# Patient Record
Sex: Male | Born: 1965 | Race: Black or African American | Hispanic: No | Marital: Single | Smoking: Never smoker
Health system: Southern US, Community
[De-identification: ages and names within clinical notes are randomized; demographics above are authoritative.]

## PROBLEM LIST (undated history)

## (undated) ENCOUNTER — Ambulatory Visit: Admission: EM | Source: Home / Self Care

## (undated) DIAGNOSIS — E785 Hyperlipidemia, unspecified: Secondary | ICD-10-CM

## (undated) DIAGNOSIS — E119 Type 2 diabetes mellitus without complications: Secondary | ICD-10-CM

## (undated) DIAGNOSIS — I10 Essential (primary) hypertension: Secondary | ICD-10-CM

## (undated) HISTORY — DX: Essential (primary) hypertension: I10

## (undated) HISTORY — PX: KNEE SURGERY: SHX244

## (undated) HISTORY — DX: Type 2 diabetes mellitus without complications: E11.9

## (undated) HISTORY — DX: Hyperlipidemia, unspecified: E78.5

---

## 1997-10-25 ENCOUNTER — Emergency Department (HOSPITAL_COMMUNITY): Admission: EM | Admit: 1997-10-25 | Discharge: 1997-10-25 | Payer: Self-pay | Admitting: Emergency Medicine

## 2018-04-15 ENCOUNTER — Encounter: Payer: Self-pay | Admitting: Gastroenterology

## 2018-05-20 ENCOUNTER — Ambulatory Visit (AMBULATORY_SURGERY_CENTER): Payer: Self-pay | Admitting: *Deleted

## 2018-05-20 ENCOUNTER — Encounter (INDEPENDENT_AMBULATORY_CARE_PROVIDER_SITE_OTHER): Payer: Self-pay

## 2018-05-20 ENCOUNTER — Encounter: Payer: Self-pay | Admitting: Gastroenterology

## 2018-05-20 VITALS — Ht 78.0 in | Wt 284.0 lb

## 2018-05-20 DIAGNOSIS — Z1211 Encounter for screening for malignant neoplasm of colon: Secondary | ICD-10-CM

## 2018-05-20 MED ORDER — NA SULFATE-K SULFATE-MG SULF 17.5-3.13-1.6 GM/177ML PO SOLN: 1.0000 | Freq: Once | ORAL | 0 refills | Status: AC

## 2018-05-20 NOTE — Progress Notes (Signed)
No egg or soy allergy known to patient  No issues with past sedation with any surgeries  or procedures, no intubation problems  No diet pills per patient No home 02 use per patient  No blood thinners per patient  Pt denies issues with constipation  No A fib or A flutter   EMMI video sent to pt's e mail  - Suprep $15 coupon to pt -

## 2018-05-23 ENCOUNTER — Encounter: Payer: Self-pay | Admitting: Gastroenterology

## 2018-05-29 ENCOUNTER — Encounter: Payer: Self-pay | Admitting: Gastroenterology

## 2018-06-18 ENCOUNTER — Ambulatory Visit (AMBULATORY_SURGERY_CENTER): Payer: 59 | Admitting: Gastroenterology

## 2018-06-18 ENCOUNTER — Encounter: Payer: Self-pay | Admitting: Gastroenterology

## 2018-06-18 VITALS — BP 116/80 | HR 73 | Temp 98.4°F | Resp 11 | Ht 78.0 in | Wt 284.0 lb

## 2018-06-18 DIAGNOSIS — K5730 Diverticulosis of large intestine without perforation or abscess without bleeding: Secondary | ICD-10-CM

## 2018-06-18 DIAGNOSIS — D175 Benign lipomatous neoplasm of intra-abdominal organs: Secondary | ICD-10-CM

## 2018-06-18 DIAGNOSIS — D125 Benign neoplasm of sigmoid colon: Secondary | ICD-10-CM

## 2018-06-18 DIAGNOSIS — Z1211 Encounter for screening for malignant neoplasm of colon: Secondary | ICD-10-CM | POA: Diagnosis present

## 2018-06-18 DIAGNOSIS — K635 Polyp of colon: Secondary | ICD-10-CM | POA: Diagnosis not present

## 2018-06-18 HISTORY — PX: COLONOSCOPY: SHX174

## 2018-06-18 MED ORDER — SODIUM CHLORIDE 0.9 % IV SOLN: 500.0000 mL | Freq: Once | INTRAVENOUS | Status: DC

## 2018-06-18 NOTE — Progress Notes (Signed)
Report to PACU, RN, vss, BBS= Clear.  

## 2018-06-18 NOTE — Progress Notes (Signed)
Called to room to assist during endoscopic procedure.  Patient ID and intended procedure confirmed with present staff. Received instructions for my participation in the procedure from the performing physician.  

## 2018-06-18 NOTE — Op Note (Signed)
[facility] Patient Name: Ryan Padilla Procedure Date: 06/18/2018 9:12 AM MRN: [id number] Endoscopist: Gerrit Heck , MD Age: 53 Referring MD:  Date of Birth: November 05, 1965 Gender: Male Account #: [id number] Procedure:                Colonoscopy Indications:              Screening for colorectal malignant neoplasm, This                            is the patient's first colonoscopy Medicines:                Monitored Anesthesia Care Procedure:                Pre-Anesthesia Assessment:                           - Prior to the procedure, a History and Physical                            was performed, and patient medications and                            allergies were reviewed. The patient's tolerance of                            previous anesthesia was also reviewed. The risks                            and benefits of the procedure and the sedation                            options and risks were discussed with the patient.                            All questions were answered, and informed consent                            was obtained. Prior Anticoagulants: The patient has                            taken no previous anticoagulant or antiplatelet                            agents. ASA Grade Assessment: II - A patient with                            mild systemic disease. After reviewing the risks                            and benefits, the patient was deemed in                            satisfactory condition to undergo the procedure.  After obtaining informed consent, the colonoscope                            was passed under direct vision. Throughout the                            procedure, the patient's blood pressure, pulse, and                            oxygen saturations were monitored continuously. The                            Colonoscope was introduced through the anus and                            advanced to the the terminal  ileum. The colonoscopy                            was performed without difficulty. The patient                            tolerated the procedure well. The quality of the                            bowel preparation was adequate. Scope In: 9:22:22 AM Scope Out: 9:39:08 AM Scope Withdrawal Time: 0 hours 15 minutes 5 seconds  Total Procedure Duration: 0 hours 16 minutes 46 seconds  Findings:                 The perianal and digital rectal examinations were                            normal.                           A 8 mm polyp was found in the sigmoid colon. The                            polyp was sessile. The polyp was removed with a                            cold snare. Resection and retrieval were complete.                            Estimated blood loss was minimal.                           A few small-mouthed diverticula were found in the                            sigmoid colon, descending colon, transverse colon                            and ascending colon.  There was a small lipoma, in the sigmoid colon.                            Positive "pillow sign" noted with closed snare. The                            overlying mucosa was otherwise normal appearing.                           Retroflexion in the right colon was performed.                           The retroflexed view of the distal rectum and anal                            verge was normal and showed no anal or rectal                            abnormalities.                           The terminal ileum appeared normal. Complications:            No immediate complications. Estimated Blood Loss:     Estimated blood loss was minimal. Impression:               - One 8 mm polyp in the sigmoid colon, removed with                            a cold snare. Resected and retrieved.                           - Diverticulosis in the sigmoid colon, in the                            descending colon, in  the transverse colon and in                            the ascending colon.                           - Small lipoma in the sigmoid colon.                           - The distal rectum and anal verge are normal on                            retroflexion view.                           - The examined portion of the ileum was normal. Recommendation:           - Patient has a contact number available for  emergencies. The signs and symptoms of potential                            delayed complications were discussed with the                            patient. Return to normal activities tomorrow.                            Written discharge instructions were provided to the                            patient.                           - Resume previous diet today.                           - Continue present medications.                           - Await pathology results.                           - Repeat colonoscopy in 5-10 years for surveillance                            based on pathology results.                           - Return to GI clinic PRN. Gerrit Heck, MD 06/18/2018 9:46:42 AM

## 2018-06-18 NOTE — Patient Instructions (Signed)
YOU HAD AN ENDOSCOPIC PROCEDURE TODAY AT THE [facility]:   Refer to the procedure report that was given to you for any specific questions about what was found during the examination.  If the procedure report does not answer your questions, please call your gastroenterologist to clarify.  If you requested that your care partner not be given the details of your procedure findings, then the procedure report has been included in a sealed envelope for you to review at your convenience later.  YOU SHOULD EXPECT: Some feelings of bloating in the abdomen. Passage of more gas than usual.  Walking can help get rid of the air that was put into your GI tract during the procedure and reduce the bloating. If you had a lower endoscopy (such as a colonoscopy or flexible sigmoidoscopy) you may notice spotting of blood in your stool or on the toilet paper. If you underwent a bowel prep for your procedure, you may not have a normal bowel movement for a few days.  Please Note:  You might notice some irritation and congestion in your nose or some drainage.  This is from the oxygen used during your procedure.  There is no need for concern and it should clear up in a day or so.  SYMPTOMS TO REPORT IMMEDIATELY:   Following lower endoscopy (colonoscopy or flexible sigmoidoscopy):  Excessive amounts of blood in the stool  Significant tenderness or worsening of abdominal pains  Swelling of the abdomen that is new, acute  Fever of 100F or higher  For urgent or emergent issues, a gastroenterologist can be reached at any hour by calling [phone].   DIET:  We do recommend a small meal at first, but then you may proceed to your regular diet.  Drink plenty of fluids but you should avoid alcoholic beverages for 24 hours.  ACTIVITY:  You should plan to take it easy for the rest of today and you should NOT DRIVE or use heavy machinery until tomorrow (because of the sedation medicines used during the test).     FOLLOW UP: Our staff will call the number listed on your records the next business day following your procedure to check on you and address any questions or concerns that you may have regarding the information given to you following your procedure. If we do not reach you, we will leave a message.  However, if you are feeling well and you are not experiencing any problems, there is no need to return our call.  We will assume that you have returned to your regular daily activities without incident.  If any biopsies were taken you will be contacted by phone or by letter within the next 1-3 weeks.  Please call us at [phone] if you have not heard about the biopsies in 3 weeks.    SIGNATURES/CONFIDENTIALITY: You and/or your care partner have signed paperwork which will be entered into your electronic medical record.  These signatures attest to the fact that that the information above on your After Visit Summary has been reviewed and is understood.  Full responsibility of the confidentiality of this discharge information lies with you and/or your care-partner. 

## 2018-06-19 ENCOUNTER — Telehealth: Payer: Self-pay | Admitting: *Deleted

## 2018-06-19 NOTE — Telephone Encounter (Signed)
  Follow up Call-  Call back number 06/18/2018  Post procedure Call Back phone  # [phone]  Permission to leave phone message Yes  Some recent data might be hidden     Patient questions:  Do you have a fever, pain , or abdominal swelling? No. Pain Score  0 *  Have you tolerated food without any problems? Yes.    Have you been able to return to your normal activities? Yes.    Do you have any questions about your discharge instructions: Diet   No. Medications  No. Follow up visit  No.  Do you have questions or concerns about your Care? No.  Actions: * If pain score is 4 or above: No action needed, pain <4.

## 2018-06-21 ENCOUNTER — Encounter: Payer: Self-pay | Admitting: Gastroenterology

## 2018-09-19 ENCOUNTER — Other Ambulatory Visit: Payer: Self-pay

## 2018-09-19 ENCOUNTER — Ambulatory Visit (HOSPITAL_COMMUNITY)
Admission: EM | Admit: 2018-09-19 | Discharge: 2018-09-19 | Disposition: A | Discharge status: Home / Self Care | Payer: 59 | Attending: Physician Assistant | Admitting: Physician Assistant

## 2018-09-19 ENCOUNTER — Ambulatory Visit: Payer: Self-pay | Admitting: Family Medicine

## 2018-09-19 ENCOUNTER — Encounter (HOSPITAL_COMMUNITY): Payer: Self-pay

## 2018-09-19 DIAGNOSIS — L03011 Cellulitis of right finger: Secondary | ICD-10-CM

## 2018-09-19 DIAGNOSIS — L03012 Cellulitis of left finger: Secondary | ICD-10-CM

## 2018-09-19 MED ORDER — DOXYCYCLINE HYCLATE 100 MG PO CAPS: 100.0000 mg | ORAL_CAPSULE | Freq: Two times a day (BID) | ORAL | 0 refills | Status: DC

## 2018-09-19 NOTE — ED Triage Notes (Signed)
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

## 2018-09-19 NOTE — Discharge Instructions (Signed)
Start doxycycline as directed. Warm compress daily. Monitor for spreading redness, increased warmth, fever, follow up for reevaluation needed.

## 2018-09-19 NOTE — Telephone Encounter (Signed)
Pt. Calling for urgent care information in [place]. PCP is in [place]. Given [facility] information - locations and hours of operation.

## 2018-09-19 NOTE — ED Provider Notes (Addendum)
[facility]    CSN: [id number] Arrival date & time: 09/19/18  8921     History   Chief Complaint Chief Complaint  Patient presents with  . Abscess    HPI Ryan Padilla is a 53 y.o. male.   53 year old male comes in for few day history of left finger pain with swelling.  Has also had erythema, warmth.  Swelling has been gradually worsening, and patient noticed self drainage of purulent fluid today.  Denies fever, chills, night sweats.  Has decreased range of motion due to swelling and pain.  Denies numbness, tingling.  Has history of MRSA. Has not taken anything for the symptoms.      Past Medical History:  Diagnosis Date  . Hypertension     There are no active problems to display for this patient.   Past Surgical History:  Procedure Laterality Date  . KNEE SURGERY Right    1985, 1991       Home Medications    Prior to Admission medications   Medication Sig Start Date End Date Taking? Authorizing Provider  valsartan (DIOVAN) 80 MG tablet Take by mouth. 03/29/18  Yes [provider]  clindamycin (CLEOCIN) 300 MG capsule Take 1 capsule (300 mg total) by mouth 3 (three) times daily for 10 days. 09/21/18 10/01/18  Scot Jun, FNP  predniSONE (DELTASONE) 20 MG tablet Take 3 tablets (60 mg total) by mouth daily with breakfast for 3 days. Start medication 09/23/18 take for 3 days only 09/23/18 09/26/18  Scot Jun, FNP    Family History Family History  Problem Relation Age of Onset  . Hypertension Mother   . Colon cancer Neg Hx   . Colon polyps Neg Hx   . Esophageal cancer Neg Hx   . Rectal cancer Neg Hx   . Stomach cancer Neg Hx     Social History Social History   Tobacco Use  . Smoking status: Never Smoker  . Smokeless tobacco: Never Used  Substance Use Topics  . Alcohol use: Yes    Comment: occ  . Drug use: Never     Allergies   Patient has no known allergies.   Review of Systems Review of Systems  Reason unable to  perform ROS: See HPI as above.     Physical Exam Triage Vital Signs ED Triage Vitals  Enc Vitals Group     BP 09/19/18 0854 (!) 147/99     Pulse Rate 09/19/18 0854 96     Resp 09/19/18 0854 18     Temp 09/19/18 0854 97.8 F (36.6 C)     Temp src --      SpO2 09/19/18 0854 97 %     Weight --      Height --      Head Circumference --      Peak Flow --      Pain Score 09/19/18 0856 7     Pain Loc --      Pain Edu? --      Excl. in [place]? --    No data found.  Updated Vital Signs BP (!) 147/99   Pulse 96   Temp 97.8 F (36.6 C)   Resp 18   SpO2 97%   Physical Exam Constitutional:      General: He is not in acute distress.    Appearance: He is well-developed. He is not diaphoretic.  HENT:     Head: Normocephalic and atraumatic.  Eyes:  Conjunctiva/sclera: Conjunctivae normal.     Pupils: Pupils are equal, round, and reactive to light.  Musculoskeletal:     Comments: See picture below.  Erythema, swelling, warmth to the left index finger extending to the dorsal hand.  No fluctuance felt.  Diffuse tenderness to palpation of PIP, hand. No tenderness to palpation distal finger. Decreased ROM. Sensation intact, cap refill <2s  Neurological:     Mental Status: He is alert and oriented to person, place, and time.        UC Treatments / Results  Labs (all labs ordered are listed, but only abnormal results are displayed) Labs Reviewed - No data to display  EKG None  Radiology No results found.  Procedures Procedures (including critical care time)  Medications Ordered in UC Medications - No data to display  Initial Impression / Assessment and Plan / UC Course  I have reviewed the triage vital signs and the nursing notes.  Pertinent labs & imaging results that were available during my care of the patient were reviewed by me and considered in my medical decision making (see chart for details).    Start doxycycline as directed.  Warm compress, rest.  Return  precautions given.  Patient expresses understanding and agrees to plan.  Final Clinical Impressions(s) / UC Diagnoses   Final diagnoses:  Cellulitis of left index finger    ED Prescriptions    Medication Sig Dispense Auth. Provider   doxycycline (VIBRAMYCIN) 100 MG capsule Take 1 capsule (100 mg total) by mouth 2 (two) times daily. 20 capsule Oneita Kras 09/19/18 [place],  V, PA-C 09/26/18 1115

## 2018-09-19 NOTE — ED Notes (Signed)
Patient verbalizes understanding of discharge instructions. Opportunity for questioning and answers were provided. Patient discharged from [facility] by provider.  

## 2018-09-21 ENCOUNTER — Other Ambulatory Visit: Payer: Self-pay

## 2018-09-21 ENCOUNTER — Encounter (HOSPITAL_COMMUNITY): Payer: Self-pay

## 2018-09-21 ENCOUNTER — Ambulatory Visit (HOSPITAL_COMMUNITY)
Admission: EM | Admit: 2018-09-21 | Discharge: 2018-09-21 | Disposition: A | Discharge status: Home / Self Care | Payer: 59 | Attending: Family Medicine | Admitting: Family Medicine

## 2018-09-21 DIAGNOSIS — L0291 Cutaneous abscess, unspecified: Secondary | ICD-10-CM | POA: Insufficient documentation

## 2018-09-21 DIAGNOSIS — L03012 Cellulitis of left finger: Secondary | ICD-10-CM | POA: Diagnosis not present

## 2018-09-21 DIAGNOSIS — Z23 Encounter for immunization: Secondary | ICD-10-CM

## 2018-09-21 DIAGNOSIS — L02512 Cutaneous abscess of left hand: Secondary | ICD-10-CM | POA: Diagnosis not present

## 2018-09-21 LAB — CBC
HCT: 47 % (ref 39.0–52.0)
Hemoglobin: 15.7 g/dL (ref 13.0–17.0)
MCH: 30.2 pg (ref 26.0–34.0)
MCHC: 33.4 g/dL (ref 30.0–36.0)
MCV: 90.4 fL (ref 80.0–100.0)
Platelets: 211 10*3/uL (ref 150–400)
RBC: 5.2 MIL/uL (ref 4.22–5.81)
RDW: 14.2 % (ref 11.5–15.5)
WBC: 7.3 10*3/uL (ref 4.0–10.5)
nRBC: 0 % (ref 0.0–0.2)

## 2018-09-21 MED ORDER — BUPIVACAINE HCL (PF) 0.5 % IJ SOLN
INTRAMUSCULAR | Status: AC
  Filled 2018-09-21: qty 10

## 2018-09-21 MED ORDER — LIDOCAINE-EPINEPHRINE (PF) 2 %-1:200000 IJ SOLN
INTRAMUSCULAR | Status: AC
  Filled 2018-09-21: qty 20

## 2018-09-21 MED ORDER — CLINDAMYCIN HCL 300 MG PO CAPS: 300.0000 mg | ORAL_CAPSULE | Freq: Three times a day (TID) | ORAL | 0 refills | Status: AC

## 2018-09-21 MED ORDER — CEFTRIAXONE SODIUM 250 MG IJ SOLR
250.0000 mg | Freq: Once | INTRAMUSCULAR | Status: AC
  Administered 2018-09-21: 250 mg via INTRAMUSCULAR

## 2018-09-21 MED ORDER — TETANUS-DIPHTHERIA TOXOIDS TD 5-2 LFU IM INJ
0.5000 mL | INJECTION | Freq: Once | INTRAMUSCULAR | Status: AC
  Administered 2018-09-21: 0.5 mL via INTRAMUSCULAR

## 2018-09-21 MED ORDER — CEFTRIAXONE SODIUM 250 MG IJ SOLR
INTRAMUSCULAR | Status: AC
  Filled 2018-09-21: qty 250

## 2018-09-21 MED ORDER — PREDNISONE 20 MG PO TABS: 60.0000 mg | ORAL_TABLET | Freq: Every day | ORAL | 0 refills | Status: DC

## 2018-09-21 MED ORDER — LIDOCAINE HCL 2 % IJ SOLN
INTRAMUSCULAR | Status: AC
  Filled 2018-09-21: qty 20

## 2018-09-21 MED ORDER — TETANUS-DIPHTH-ACELL PERTUSSIS 5-2.5-18.5 LF-MCG/0.5 IM SUSP
INTRAMUSCULAR | Status: AC
  Filled 2018-09-21: qty 0.5

## 2018-09-21 NOTE — ED Triage Notes (Signed)
Pt presents for a follow up on left index finger from visit from a few days ago; pt says finger is not getting better.

## 2018-09-21 NOTE — ED Provider Notes (Signed)
[facility]    CSN: [id number] Arrival date & time: 09/21/18  1141     History   Chief Complaint Chief Complaint  Patient presents with  . Follow-up    HPI Ryan Padilla is a 53 y.o. male.   HPI  Ryan Padilla presents for follow-up of left index finger swelling. Patient was seen by a provider here at urgent care, 4 days prior for the same problem and diagnosed with cellulitis. He has taken 4 days to Doxycyline and symptoms have since worsened. He is having increased tenderness, swelling, and redness with poor ROM of left index finger. He has noticed mild swelling of the lower portion of left anterior hand. He has history of MRSA. He denies fever, chills, or GI symptoms. Past Medical History:  Diagnosis Date  . Hypertension     There are no active problems to display for this patient.   Past Surgical History:  Procedure Laterality Date  . KNEE SURGERY Right    1985, 1991       Home Medications    Prior to Admission medications   Medication Sig Start Date End Date Taking? Authorizing Provider  doxycycline (VIBRAMYCIN) 100 MG capsule Take 1 capsule (100 mg total) by mouth 2 (two) times daily. 09/19/18   Tasia Catchings, Amy V, PA-C  valsartan (DIOVAN) 80 MG tablet Take by mouth. 03/29/18   [provider]    Family History Family History  Problem Relation Age of Onset  . Hypertension Mother   . Colon cancer Neg Hx   . Colon polyps Neg Hx   . Esophageal cancer Neg Hx   . Rectal cancer Neg Hx   . Stomach cancer Neg Hx     Social History Social History   Tobacco Use  . Smoking status: Never Smoker  . Smokeless tobacco: Never Used  Substance Use Topics  . Alcohol use: Yes    Comment: occ  . Drug use: Never     Allergies   Patient has no known allergies.   Review of Systems Review of Systems Pertinent negatives listed in HPI Physical Exam Triage Vital Signs ED Triage Vitals  Enc Vitals Group     BP 09/21/18 1158 (!) 153/102     Pulse Rate  09/21/18 1158 (!) 103     Resp 09/21/18 1158 20     Temp 09/21/18 1158 98 F (36.7 C)     Temp Source 09/21/18 1158 Oral     SpO2 09/21/18 1158 95 %     Weight --      Height --      Head Circumference --      Peak Flow --      Pain Score 09/21/18 1159 8     Pain Loc --      Pain Edu? --      Excl. in [place]? --    No data found.  Updated Vital Signs BP (!) 153/102 (BP Location: Left Arm)   Pulse (!) 103   Temp 98 F (36.7 C) (Oral)   Resp 20   SpO2 95%   Visual Acuity Right Eye Distance:   Left Eye Distance:   Bilateral Distance:    Right Eye Near:   Left Eye Near:    Bilateral Near:     Physical Exam Constitutional:      Appearance: Normal appearance.  Cardiovascular:     Rate and Rhythm: Normal rate.  Pulmonary:     Effort: Pulmonary effort is normal.  Breath sounds: Normal breath sounds.  Musculoskeletal:        General: Swelling and tenderness present.  Skin:    General: Skin is warm.     Capillary Refill: Capillary refill takes less than 2 seconds.     Findings: Erythema and lesion present.  Neurological:     Mental Status: He is alert.    Marland Kitchenkshn          UC Treatments / Results  Labs (all labs ordered are listed, but only abnormal results are displayed) Labs Reviewed  AEROBIC CULTURE (SUPERFICIAL SPECIMEN)  CBC    EKG None  Radiology No results found.  Procedures Incision and Drainage Date/Time: 09/23/2018 6:33 AM Performed by: Scot Jun, FNP Authorized by: Scot Jun, FNP   Consent:    Consent obtained:  Verbal   Risks discussed:  Incomplete drainage and infection   Alternatives discussed:  Delayed treatment Location:    Type:  Abscess   Location:  Upper extremity   Upper extremity location:  Finger   Finger location:  L index finger Pre-procedure details:    Skin preparation:  Betadine Anesthesia (see MAR for exact dosages):    Anesthesia method:  Local infiltration and nerve block   Local  anesthetic:  Lidocaine 2% WITH epi and bupivacaine 0.5% w/o epi Procedure type:    Complexity:  Simple Procedure details:    Incision types:  Stab incision   Incision depth:  Subcutaneous   Scalpel blade:  10   Drainage:  Bloody and purulent   Drainage amount:  Moderate   Wound treatment:  Wound left open   Packing materials:  None Post-procedure details:    Patient tolerance of procedure:  Tolerated well, no immediate complications Comments:     Dressed with non-adherent pressure dressing secured with [place]    (including critical care time)  Medications Ordered in UC Medications - No data to display  Initial Impression / Assessment and Plan / UC Course  I have reviewed the triage vital signs and the nursing notes.  Pertinent labs & imaging results that were available during my care of the patient were reviewed by me and considered in my medical decision making (see chart for details).    I &D performed and a gross amount of purulent material expressed from site. Patient advised if infection doesn't improve, a second I&D may be warranted given the massive size of abscess. Changing antibiotic therapy, as infection has remained non-responsive to Doxycyline. CBC normal, which is reassuring. Prednisone prescribed to start in 3 days if swelling of hand and index finger have not improved. Work note provided. Red Flags discussed. Patient verbalized understanding and agreement of plan. Final Clinical Impressions(s) / UC Diagnoses   Final diagnoses:  Cellulitis of finger of left hand     Discharge Instructions     Stop Doxycyline. Start Clindamycin 300 mg 3 times daily for infection. You should see improvement in 3-4 days, if not or if swelling worsens return for evaluation. Start prednisone 09/23/18 (Monday) only if swelling has not improved. Return for follow-up here if no improvement. Your blood work was normal.     ED Prescriptions    Medication Sig Dispense Auth. Provider    clindamycin (CLEOCIN) 300 MG capsule Take 1 capsule (300 mg total) by mouth 3 (three) times daily for 10 days. 30 capsule Scot Jun, FNP   predniSONE (DELTASONE) 20 MG tablet Take 3 tablets (60 mg total) by mouth daily with breakfast for 3 days.  Start medication 09/23/18 take for 3 days only 9 tablet Scot Jun, FNP     Controlled Substance Prescriptions [place] Controlled Substance Registry consulted? Not Applicable   Scot Jun, FNP 09/23/18 1954

## 2018-09-21 NOTE — Discharge Instructions (Addendum)
Stop Doxycyline. Start Clindamycin 300 mg 3 times daily for infection. You should see improvement in 3-4 days, if not or if swelling worsens return for evaluation. Start prednisone 09/23/18 (Monday) only if swelling has not improved. Return for follow-up here if no improvement. Your blood work was normal.

## 2018-09-23 ENCOUNTER — Telehealth (HOSPITAL_COMMUNITY): Payer: Self-pay | Admitting: Emergency Medicine

## 2018-09-23 DIAGNOSIS — L03012 Cellulitis of left finger: Secondary | ICD-10-CM

## 2018-09-23 DIAGNOSIS — L02512 Cutaneous abscess of left hand: Secondary | ICD-10-CM

## 2018-09-23 LAB — AEROBIC CULTURE W GRAM STAIN (SUPERFICIAL SPECIMEN): Gram Stain: NONE SEEN

## 2018-09-23 LAB — AEROBIC CULTURE? (SUPERFICIAL SPECIMEN): Special Requests: NORMAL

## 2018-09-23 NOTE — Telephone Encounter (Signed)
Attempted to reach patient. Call cannot be completed

## 2018-09-26 ENCOUNTER — Ambulatory Visit (HOSPITAL_COMMUNITY)
Admission: EM | Admit: 2018-09-26 | Discharge: 2018-09-26 | Disposition: A | Discharge status: Home / Self Care | Payer: 59 | Attending: Family Medicine | Admitting: Family Medicine

## 2018-09-26 ENCOUNTER — Ambulatory Visit (INDEPENDENT_AMBULATORY_CARE_PROVIDER_SITE_OTHER): Payer: 59

## 2018-09-26 ENCOUNTER — Encounter (HOSPITAL_COMMUNITY): Payer: Self-pay | Admitting: Emergency Medicine

## 2018-09-26 ENCOUNTER — Other Ambulatory Visit: Payer: Self-pay

## 2018-09-26 DIAGNOSIS — Z5189 Encounter for other specified aftercare: Secondary | ICD-10-CM | POA: Diagnosis not present

## 2018-09-26 NOTE — Discharge Instructions (Signed)
Finish clindamycin Continue ice, and working on range of motion

## 2018-09-26 NOTE — ED Provider Notes (Signed)
Ryan Padilla    CSN: [id number] Arrival date & time: 09/26/18  1045     History   Chief Complaint Chief Complaint  Patient presents with  . Wound Check  . Letter for School/Work    HPI Ryan Padilla is a 53 y.o. male history of hypertension, presenting today for follow-up of left index finger infection.  Patient was seen here initially and treated with doxycycline, was seen on Saturday, approximately 4 to 5 days ago and had I&D performed of abscess to finger.  He is also taking clindamycin.  He has had improvement in his symptoms and has had slight regain of motion to his finger, but is concerned that he still cannot fully utilize his finger.  Because of this he is also requesting work note as he is unable to perform his duties.  Denies any fevers.  Denies numbness or tingling.  HPI  Past Medical History:  Diagnosis Date  . Hypertension     There are no active problems to display for this patient.   Past Surgical History:  Procedure Laterality Date  . KNEE SURGERY Right    1985, 1991       Home Medications    Prior to Admission medications   Medication Sig Start Date End Date Taking? Authorizing Provider  clindamycin (CLEOCIN) 300 MG capsule Take 1 capsule (300 mg total) by mouth 3 (three) times daily for 10 days. 09/21/18 10/01/18 Yes Scot Jun, FNP  valsartan (DIOVAN) 80 MG tablet Take by mouth. 03/29/18  Yes [provider]    Family History Family History  Problem Relation Age of Onset  . Hypertension Mother   . Colon cancer Neg Hx   . Colon polyps Neg Hx   . Esophageal cancer Neg Hx   . Rectal cancer Neg Hx   . Stomach cancer Neg Hx     Social History Social History   Tobacco Use  . Smoking status: Never Smoker  . Smokeless tobacco: Never Used  Substance Use Topics  . Alcohol use: Yes    Comment: occ  . Drug use: Never     Allergies   Patient has no known allergies.   Review of Systems Review of Systems   Constitutional: Negative for fatigue and fever.  Eyes: Negative for redness, itching and visual disturbance.  Respiratory: Negative for shortness of breath.   Cardiovascular: Negative for chest pain and leg swelling.  Gastrointestinal: Negative for nausea and vomiting.  Musculoskeletal: Positive for joint swelling. Negative for arthralgias and myalgias.  Skin: Positive for color change. Negative for rash and wound.  Neurological: Negative for dizziness, syncope, weakness, light-headedness and headaches.     Physical Exam Triage Vital Signs ED Triage Vitals  Enc Vitals Group     BP 09/26/18 1056 (!) 155/87     Pulse Rate 09/26/18 1056 89     Resp 09/26/18 1056 18     Temp 09/26/18 1056 97.9 F (36.6 C)     Temp Source 09/26/18 1056 Oral     SpO2 09/26/18 1056 97 %     Weight --      Height --      Head Circumference --      Peak Flow --      Pain Score 09/26/18 1054 2     Pain Loc --      Pain Edu? --      Excl. in [place]? --    No data found.  Updated Vital Signs BP Marland Kitchen)  155/87 (BP Location: Right Arm) Comment: large cuff  Pulse 89   Temp 97.9 F (36.6 C) (Oral)   Resp 18   SpO2 97%   Visual Acuity Right Eye Distance:   Left Eye Distance:   Bilateral Distance:    Right Eye Near:   Left Eye Near:    Bilateral Near:     Physical Exam Vitals signs and nursing note reviewed.  Constitutional:      Appearance: He is well-developed.     Comments: No acute distress  HENT:     Head: Normocephalic and atraumatic.     Nose: Nose normal.  Eyes:     Conjunctiva/sclera: Conjunctivae normal.  Neck:     Musculoskeletal: Neck supple.  Cardiovascular:     Rate and Rhythm: Normal rate.  Pulmonary:     Effort: Pulmonary effort is normal. No respiratory distress.  Abdominal:     General: There is no distension.  Musculoskeletal: Normal range of motion.     Comments: Radial pulse 2+ on left wrist Limited range of motion at metacarpal/phalangeal joint as well as PIP of the  left index finger.  Skin:    General: Skin is warm and dry.     Findings: Erythema present.     Comments: Left index finger with erythema mainly to proximal phalanx, skin with generalized peeling around this area as well  Neurological:     Mental Status: He is alert and oriented to person, place, and time.      UC Treatments / Results  Labs (all labs ordered are listed, but only abnormal results are displayed) Labs Reviewed - No data to display  EKG None  Radiology Dg Finger Index Left  Result Date: 09/26/2018 CLINICAL DATA:  Cellulitis involving the index finger. EXAM: LEFT INDEX FINGER 2+V COMPARISON:  None. FINDINGS: No fracture or dislocation. Diffuse soft tissue swelling about the index finger. No subcutaneous emphysema or radiopaque foreign body. No discrete areas osteolysis to suggest osteomyelitis. Joint spaces appear preserved.  No erosions. IMPRESSION: Diffuse soft tissue swelling about the index finger without associated radiopaque foreign body, subcutaneous emphysema or radiographic evidence of osteomyelitis. Electronically Signed   By: Sandi Mariscal M.D.   On: 09/26/2018 11:53    Procedures Procedures (including critical care time)  Medications Ordered in UC Medications - No data to display  Initial Impression / Assessment and Plan / UC Course  I have reviewed the triage vital signs and the nursing notes.  Pertinent labs & imaging results that were available during my care of the patient were reviewed by me and considered in my medical decision making (see chart for details).     X-ray negative for signs of osteomyelitis.  Likely limited range of motion related to continued swelling.  Will have patient continue course of clindamycin.  Anti-inflammatories and ice.  Continue to work on regaining full range of motion.  Discussed if symptoms persisting despite full resolution of infection and swelling may need occupational therapy, if limiting work/life.Discussed strict  return precautions. Patient verbalized understanding and is agreeable with plan.  Final Clinical Impressions(s) / UC Diagnoses   Final diagnoses:  Visit for wound check     Discharge Instructions     Finish clindamycin Continue ice, and working on range of motion     ED Prescriptions    None     Controlled Substance Prescriptions [place] Controlled Substance Registry consulted? Not Applicable   Janith L[place], [place] 09/26/18 1212

## 2018-09-26 NOTE — ED Triage Notes (Addendum)
Patient says left index finger is doing much better.  Requesting a note for work.

## 2019-07-21 ENCOUNTER — Ambulatory Visit: Payer: 59 | Admitting: Family Medicine

## 2019-07-28 DIAGNOSIS — Z6834 Body mass index (BMI) 34.0-34.9, adult: Secondary | ICD-10-CM | POA: Diagnosis not present

## 2019-07-28 DIAGNOSIS — E785 Hyperlipidemia, unspecified: Secondary | ICD-10-CM | POA: Diagnosis not present

## 2019-07-28 DIAGNOSIS — Z1331 Encounter for screening for depression: Secondary | ICD-10-CM | POA: Diagnosis not present

## 2019-07-28 DIAGNOSIS — I10 Essential (primary) hypertension: Secondary | ICD-10-CM | POA: Diagnosis not present

## 2019-07-28 DIAGNOSIS — E119 Type 2 diabetes mellitus without complications: Secondary | ICD-10-CM | POA: Diagnosis not present

## 2019-08-02 ENCOUNTER — Ambulatory Visit: Payer: BC Managed Care – PPO | Attending: Internal Medicine

## 2019-08-02 DIAGNOSIS — Z23 Encounter for immunization: Secondary | ICD-10-CM

## 2019-08-02 NOTE — Progress Notes (Addendum)
   Covid-19 Vaccination Clinic  Name:  Ryan Padilla    MRN: [id number] DOB: [birth year]  08/02/2019  [name] was observed post Covid-19 immunization for 15 minutes without incident. He was provided with Vaccine Information Sheet and instruction to access the V-Safe system.   [name] was instructed to call 911 with any severe reactions post vaccine: Marland Kitchen Difficulty breathing  . Swelling of face and throat  . A fast heartbeat  . A bad rash all over body  . Dizziness and weakness   Immunizations Administered    Name Date Dose VIS Date Route   Pfizer COVID-19 Vaccine 08/02/2019 11:48 AM 0.3 mL 05/02/2019 Intramuscular   Manufacturer: [place]   Lot: [id number]   [place]: [id number]

## 2019-08-26 ENCOUNTER — Ambulatory Visit: Payer: BC Managed Care – PPO | Attending: Internal Medicine

## 2019-08-26 DIAGNOSIS — Z23 Encounter for immunization: Secondary | ICD-10-CM

## 2019-08-26 NOTE — Progress Notes (Signed)
   Covid-19 Vaccination Clinic  Name:  Ryan Padilla    MRN: [id number] DOB: 1966-03-09  08/26/2019  [name] was observed post Covid-19 immunization for 15 minutes without incident. He was provided with Vaccine Information Sheet and instruction to access the V-Safe system.   [name] was instructed to call 911 with any severe reactions post vaccine: Marland Kitchen Difficulty breathing  . Swelling of face and throat  . A fast heartbeat  . A bad rash all over body  . Dizziness and weakness   Immunizations Administered    Name Date Dose VIS Date Route   Pfizer COVID-19 Vaccine 08/26/2019 11:09 AM 0.3 mL 05/02/2019 Intramuscular   Manufacturer: [employer]   Lot: [id number]   [place]: [id number]

## 2019-11-12 DIAGNOSIS — M7989 Other specified soft tissue disorders: Secondary | ICD-10-CM | POA: Diagnosis not present

## 2019-11-12 DIAGNOSIS — R600 Localized edema: Secondary | ICD-10-CM | POA: Diagnosis not present

## 2019-11-12 DIAGNOSIS — M79661 Pain in right lower leg: Secondary | ICD-10-CM | POA: Diagnosis not present

## 2019-11-12 DIAGNOSIS — Z6834 Body mass index (BMI) 34.0-34.9, adult: Secondary | ICD-10-CM | POA: Diagnosis not present

## 2019-11-17 DIAGNOSIS — L03115 Cellulitis of right lower limb: Secondary | ICD-10-CM | POA: Diagnosis not present

## 2019-11-17 DIAGNOSIS — R600 Localized edema: Secondary | ICD-10-CM | POA: Diagnosis not present

## 2019-11-17 DIAGNOSIS — M7989 Other specified soft tissue disorders: Secondary | ICD-10-CM | POA: Diagnosis not present

## 2019-11-26 DIAGNOSIS — E119 Type 2 diabetes mellitus without complications: Secondary | ICD-10-CM | POA: Diagnosis not present

## 2019-11-26 DIAGNOSIS — I10 Essential (primary) hypertension: Secondary | ICD-10-CM | POA: Diagnosis not present

## 2019-11-26 DIAGNOSIS — M7989 Other specified soft tissue disorders: Secondary | ICD-10-CM | POA: Diagnosis not present

## 2019-11-26 DIAGNOSIS — Z125 Encounter for screening for malignant neoplasm of prostate: Secondary | ICD-10-CM | POA: Diagnosis not present

## 2019-11-26 DIAGNOSIS — E785 Hyperlipidemia, unspecified: Secondary | ICD-10-CM | POA: Diagnosis not present

## 2019-12-01 DIAGNOSIS — M79604 Pain in right leg: Secondary | ICD-10-CM | POA: Diagnosis not present

## 2019-12-01 DIAGNOSIS — R2241 Localized swelling, mass and lump, right lower limb: Secondary | ICD-10-CM | POA: Diagnosis not present

## 2019-12-01 DIAGNOSIS — I890 Lymphedema, not elsewhere classified: Secondary | ICD-10-CM | POA: Diagnosis not present

## 2019-12-10 DIAGNOSIS — Z6834 Body mass index (BMI) 34.0-34.9, adult: Secondary | ICD-10-CM | POA: Diagnosis not present

## 2019-12-10 DIAGNOSIS — E119 Type 2 diabetes mellitus without complications: Secondary | ICD-10-CM | POA: Diagnosis not present

## 2019-12-10 DIAGNOSIS — E785 Hyperlipidemia, unspecified: Secondary | ICD-10-CM | POA: Diagnosis not present

## 2019-12-10 DIAGNOSIS — I10 Essential (primary) hypertension: Secondary | ICD-10-CM | POA: Diagnosis not present

## 2019-12-12 DIAGNOSIS — R2241 Localized swelling, mass and lump, right lower limb: Secondary | ICD-10-CM | POA: Diagnosis not present

## 2019-12-22 DIAGNOSIS — M7661 Achilles tendinitis, right leg: Secondary | ICD-10-CM | POA: Diagnosis not present

## 2020-02-29 IMAGING — DX LEFT INDEX FINGER 2+V
3 series · 3 of 3 positions shown · non-contrast
Comparison: None.

CLINICAL DATA: Cellulitis involving the index finger.

EXAM:
LEFT INDEX FINGER 2+V

[finger ap]
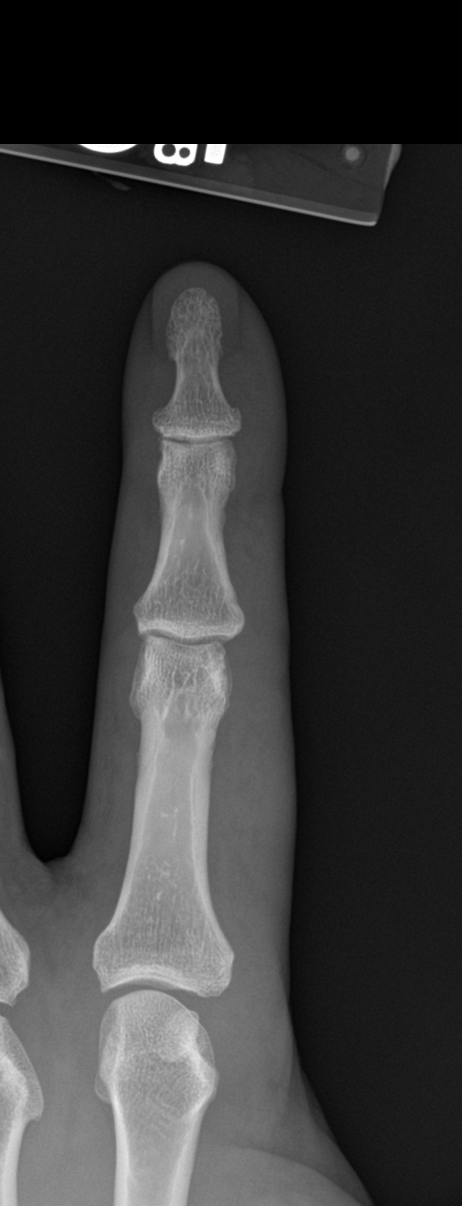

[finger obl]
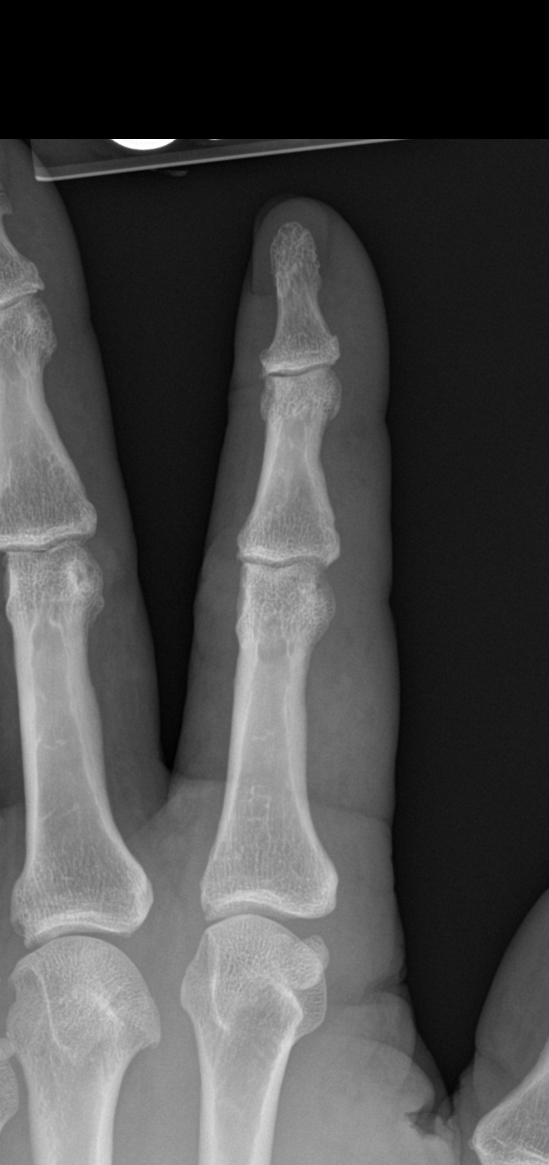

[finger lat]
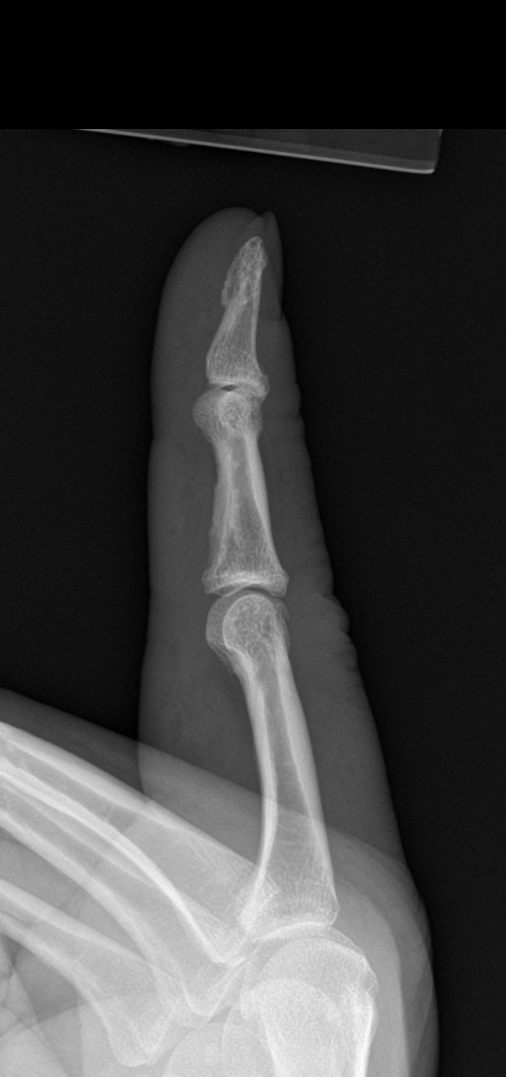

[3 of 3 positions shown; findings below may reference images not displayed]

FINDINGS: No fracture or dislocation. Diffuse soft tissue swelling about the
index finger. No subcutaneous emphysema or radiopaque foreign body.
No discrete areas osteolysis to suggest osteomyelitis.

Joint spaces appear preserved.  No erosions.
IMPRESSION: Diffuse soft tissue swelling about the index finger without
associated radiopaque foreign body, subcutaneous emphysema or
radiographic evidence of osteomyelitis.

## 2021-03-24 DIAGNOSIS — E119 Type 2 diabetes mellitus without complications: Secondary | ICD-10-CM | POA: Diagnosis not present

## 2021-03-24 DIAGNOSIS — Z125 Encounter for screening for malignant neoplasm of prostate: Secondary | ICD-10-CM | POA: Diagnosis not present

## 2021-03-24 DIAGNOSIS — E785 Hyperlipidemia, unspecified: Secondary | ICD-10-CM | POA: Diagnosis not present

## 2021-03-30 DIAGNOSIS — E785 Hyperlipidemia, unspecified: Secondary | ICD-10-CM | POA: Diagnosis not present

## 2021-03-30 DIAGNOSIS — Z23 Encounter for immunization: Secondary | ICD-10-CM | POA: Diagnosis not present

## 2021-03-30 DIAGNOSIS — E119 Type 2 diabetes mellitus without complications: Secondary | ICD-10-CM | POA: Diagnosis not present

## 2021-03-30 DIAGNOSIS — Z6834 Body mass index (BMI) 34.0-34.9, adult: Secondary | ICD-10-CM | POA: Diagnosis not present

## 2021-03-30 DIAGNOSIS — I10 Essential (primary) hypertension: Secondary | ICD-10-CM | POA: Diagnosis not present

## 2021-05-09 ENCOUNTER — Ambulatory Visit
Admission: EM | Admit: 2021-05-09 | Discharge: 2021-05-09 | Disposition: A | Discharge status: Home / Self Care | Payer: BC Managed Care – PPO | Attending: Emergency Medicine | Admitting: Emergency Medicine

## 2021-05-09 ENCOUNTER — Other Ambulatory Visit: Payer: Self-pay

## 2021-05-09 ENCOUNTER — Telehealth: Payer: Self-pay

## 2021-05-09 DIAGNOSIS — B029 Zoster without complications: Secondary | ICD-10-CM | POA: Diagnosis not present

## 2021-05-09 MED ORDER — GABAPENTIN 600 MG PO TABS: 600.0000 mg | ORAL_TABLET | Freq: Three times a day (TID) | ORAL | 0 refills | Status: DC

## 2021-05-09 MED ORDER — VALACYCLOVIR HCL 1 G PO TABS: 1000.0000 mg | ORAL_TABLET | Freq: Three times a day (TID) | ORAL | 0 refills | Status: AC

## 2021-05-09 MED ORDER — VALACYCLOVIR HCL 1 G PO TABS: 1000.0000 mg | ORAL_TABLET | Freq: Three times a day (TID) | ORAL | 0 refills | Status: DC

## 2021-05-09 NOTE — ED Triage Notes (Signed)
Pt presents with painful & itchy rash across left torso, chest and back X 2 days.

## 2021-05-09 NOTE — Discharge Instructions (Signed)
Please begin valacyclovir today, it is important that you try to get at least 2 doses before you go to bed, make sure the 2 doses you take today are 8 hours apart.  Please continue valacyclovir 3 times daily until complete, please not try not to miss any doses.  Gabapentin is a strong nerve pain reducer, feel free to begin that today as well, you can also take this medication up to 3 times daily as needed but does not have to be taken if you do not feel like you need it.

## 2021-05-09 NOTE — ED Provider Notes (Signed)
[facility]    CSN: [id number] Arrival date & time: 05/09/21  1534    HISTORY   Chief Complaint  Patient presents with   Rash   HPI Ryan Padilla is a 55 y.o. male. Pt presents with painful & itchy rash across left torso, chest and back X 2 days, states that initially began on his back then spread around his left side to his left chest.  Denies a history of shingles, states he has not received the shingles vaccine.  Patient denies fever, aches, chills, nausea, vomiting, diarrhea, or headache.  Patient states that the rash kept him awake last night because the itching is now starting to become a little bit painful.  The history is provided by the patient.  Past Medical History:  Diagnosis Date   Hypertension    There are no problems to display for this patient.  Past Surgical History:  Procedure Laterality Date   KNEE SURGERY Right    1985, 1991    Home Medications    Prior to Admission medications   Medication Sig Start Date End Date Taking? Authorizing Provider  valsartan (DIOVAN) 80 MG tablet Take by mouth. 03/29/18   [provider]    Family History Family History  Problem Relation Age of Onset   Hypertension Mother    Colon cancer Neg Hx    Colon polyps Neg Hx    Esophageal cancer Neg Hx    Rectal cancer Neg Hx    Stomach cancer Neg Hx    Social History Social History   Tobacco Use   Smoking status: Never   Smokeless tobacco: Never  Substance Use Topics   Alcohol use: Yes    Comment: occ   Drug use: Never   Allergies   Patient has no known allergies.  Review of Systems Review of Systems Pertinent findings noted in history of present illness.   Physical Exam Triage Vital Signs ED Triage Vitals  Enc Vitals Group     BP 03/18/21 0827 (!) 147/82     Pulse Rate 03/18/21 0827 72     Resp 03/18/21 0827 18     Temp 03/18/21 0827 98.3 F (36.8 C)     Temp Source 03/18/21 0827 Oral     SpO2 03/18/21 0827 98 %     Weight --       Height --      Head Circumference --      Peak Flow --      Pain Score 03/18/21 0826 5     Pain Loc --      Pain Edu? --      Excl. in [place]? --   No data found.  Updated Vital Signs BP (!) 142/99 (BP Location: Right Arm)    Pulse 80    Temp 98.7 F (37.1 C) (Oral)    Resp 18    SpO2 95%   Physical Exam Vitals and nursing note reviewed.  Constitutional:      General: He is not in acute distress.    Appearance: Normal appearance. He is not ill-appearing.  HENT:     Head: Normocephalic and atraumatic.  Eyes:     General: Lids are normal.        Right eye: No discharge.        Left eye: No discharge.     Extraocular Movements: Extraocular movements intact.     Conjunctiva/sclera: Conjunctivae normal.     Right eye: Right conjunctiva is not injected.  Left eye: Left conjunctiva is not injected.  Neck:     Trachea: Trachea and phonation normal.  Cardiovascular:     Rate and Rhythm: Normal rate and regular rhythm.     Pulses: Normal pulses.     Heart sounds: Normal heart sounds. No murmur heard.   No friction rub. No gallop.  Pulmonary:     Effort: Pulmonary effort is normal. No accessory muscle usage, prolonged expiration or respiratory distress.     Breath sounds: Normal breath sounds. No stridor, decreased air movement or transmitted upper airway sounds. No decreased breath sounds, wheezing, rhonchi or rales.  Chest:     Chest wall: No tenderness.  Musculoskeletal:        General: Normal range of motion.     Cervical back: Normal range of motion and neck supple. Normal range of motion.  Lymphadenopathy:     Cervical: No cervical adenopathy.  Skin:    General: Skin is warm and dry.     Findings: No erythema or rash (TNTC fluid-filled blisters on erythematous base in a dermatomal pattern).  Neurological:     General: No focal deficit present.     Mental Status: He is alert and oriented to person, place, and time.  Psychiatric:        Mood and Affect: Mood normal.         Behavior: Behavior normal.    Visual Acuity Right Eye Distance:   Left Eye Distance:   Bilateral Distance:    Right Eye Near:   Left Eye Near:    Bilateral Near:     UC Couse / Diagnostics / Procedures:    EKG  Radiology No results found.  Procedures Procedures (including critical care time)  UC Diagnoses / Final Clinical Impressions(s)   I have reviewed the triage vital signs and the nursing notes.  Pertinent labs & imaging results that were available during my care of the patient were reviewed by me and considered in my medical decision making (see chart for details).    Final diagnoses:  Herpes zoster without complication   Patient is barely within the 2-day window to begin valacyclovir, patient advised to take 1 g 3 times daily for the next 7 days.  Patient advised to begin gabapentin as needed for pain.  Patient advised to follow-up with his primary as needed for unrelieved pain.  ED Prescriptions     Medication Sig Dispense Auth. Provider   valACYclovir (VALTREX) 1000 MG tablet Take 1 tablet (1,000 mg total) by mouth 3 (three) times daily for 7 days. 21 tablet Lynden Oxford Scales, PA-C   gabapentin (NEURONTIN) 600 MG tablet Take 1 tablet (600 mg total) by mouth 3 (three) times daily for 10 days. 30 tablet Lynden Oxford Scales, PA-C      PDMP not reviewed this encounter.  Pending results:  Labs Reviewed - No data to display  Medications Ordered in UC: Medications - No data to display  Disposition Upon Discharge:  Condition: stable for discharge home Home: take medications as prescribed; routine discharge instructions as discussed; follow up as advised.  Patient presented with an acute illness with associated systemic symptoms and significant discomfort requiring urgent management. In my opinion, this is a condition that a prudent lay person (someone who possesses an average knowledge of health and medicine) may potentially expect to result in  complications if not addressed urgently such as respiratory distress, impairment of bodily function or dysfunction of bodily organs.   Routine symptom specific, illness  specific and/or disease specific instructions were discussed with the patient and/or caregiver at length.   As such, the patient has been evaluated and assessed, work-up was performed and treatment was provided in alignment with urgent care protocols and evidence based medicine.  Patient/parent/caregiver has been advised that the patient may require follow up for further testing and treatment if the symptoms continue in spite of treatment, as clinically indicated and appropriate.  If the patient was tested for COVID-19, Influenza and/or RSV, then the patient/parent/guardian was advised to isolate at home pending the results of his/her diagnostic coronavirus test and potentially longer if theyre positive. I have also advised pt that if his/her COVID-19 test returns positive, it's recommended to self-isolate for at least 10 days after symptoms first appeared AND until fever-free for 24 hours without fever reducer AND other symptoms have improved or resolved. Discussed self-isolation recommendations as well as instructions for household member/close contacts as per the [facility] and [place] DHHS, and also gave patient the [place] packet with this information.  Patient/parent/caregiver has been advised to return to the [facility] or PCP in 3-5 days if no better; to PCP or the Emergency Department if new signs and symptoms develop, or if the current signs or symptoms continue to change or worsen for further workup, evaluation and treatment as clinically indicated and appropriate  The patient will follow up with their current PCP if and as advised. If the patient does not currently have a PCP we will assist them in obtaining one.   The patient may need specialty follow up if the symptoms continue, in spite of conservative treatment and management, for further  workup, evaluation, consultation and treatment as clinically indicated and appropriate.   Patient/parent/caregiver verbalized understanding and agreement of plan as discussed.  All questions were addressed during visit.  Please see discharge instructions below for further details of plan.  Discharge Instructions:   Discharge Instructions      Please begin valacyclovir today, it is important that you try to get at least 2 doses before you go to bed, make sure the 2 doses you take today are 8 hours apart.  Please continue valacyclovir 3 times daily until complete, please not try not to miss any doses.  Gabapentin is a strong nerve pain reducer, feel free to begin that today as well, you can also take this medication up to 3 times daily as needed but does not have to be taken if you do not feel like you need it.      This office note has been dictated using Museum/gallery curator.  Unfortunately, and despite my best efforts, this method of dictation can sometimes lead to occasional typographical or grammatical errors.  I apologize in advance if this occurs.     Lynden Oxford [place], [place] 05/10/21 [phone]

## 2021-08-08 ENCOUNTER — Other Ambulatory Visit: Payer: Self-pay

## 2021-08-08 ENCOUNTER — Ambulatory Visit
Admission: EM | Admit: 2021-08-08 | Discharge: 2021-08-08 | Disposition: A | Discharge status: Home / Self Care | Payer: BC Managed Care – PPO | Attending: Emergency Medicine | Admitting: Emergency Medicine

## 2021-08-08 DIAGNOSIS — R051 Acute cough: Secondary | ICD-10-CM | POA: Diagnosis not present

## 2021-08-08 DIAGNOSIS — Z20822 Contact with and (suspected) exposure to covid-19: Secondary | ICD-10-CM | POA: Diagnosis not present

## 2021-08-08 DIAGNOSIS — J310 Chronic rhinitis: Secondary | ICD-10-CM | POA: Diagnosis not present

## 2021-08-08 DIAGNOSIS — J329 Chronic sinusitis, unspecified: Secondary | ICD-10-CM

## 2021-08-08 MED ORDER — PROMETHAZINE-DM 6.25-15 MG/5ML PO SYRP: 5.0000 mL | ORAL_SOLUTION | Freq: Four times a day (QID) | ORAL | 0 refills | Status: DC | PRN

## 2021-08-08 MED ORDER — IPRATROPIUM BROMIDE 0.06 % NA SOLN: 2.0000 | Freq: Four times a day (QID) | NASAL | 0 refills | Status: DC

## 2021-08-08 NOTE — ED Provider Notes (Signed)
?[facility] ? ? ? ?CSN: [id number] ?Arrival date & time: 08/08/21  4193 ?  ? ?HISTORY  ? ?Chief Complaint  ?Patient presents with  ? Cough  ? ?HPI ?Ryan Padilla is a 56 y.o. male. Pt states this weekend he has had a productive cough. He is requesting to be tested for Covid d/t possible exposure.  Patient states the cough is initially productive of thick yellow sputum but has become more clear and more scant at this time.  Patient is normal vital signs on arrival.  Patient denies frequent upper respiratory infections.  Patient denies a history of seasonal allergies and asthma.  Patient denies nausea, vomiting, diarrhea. ? ?The history is provided by the patient.  ?Past Medical History:  ?Diagnosis Date  ? Hypertension   ? ?There are no problems to display for this patient. ? ?Past Surgical History:  ?Procedure Laterality Date  ? KNEE SURGERY Right   ? 1985, 1991  ? ? ?Home Medications   ? ?Prior to Admission medications   ?Medication Sig Start Date End Date Taking? Authorizing Provider  ?gabapentin (NEURONTIN) 600 MG tablet Take 1 tablet (600 mg total) by mouth 3 (three) times daily for 10 days. 05/09/21 05/19/21  Lynden Oxford Scales, PA-C  ?valsartan (DIOVAN) 80 MG tablet Take by mouth. 03/29/18   [provider]  ? ?Family History ?Family History  ?Problem Relation Age of Onset  ? Hypertension Mother   ? Colon cancer Neg Hx   ? Colon polyps Neg Hx   ? Esophageal cancer Neg Hx   ? Rectal cancer Neg Hx   ? Stomach cancer Neg Hx   ? ?Social History ?Social History  ? ?Tobacco Use  ? Smoking status: Never  ? Smokeless tobacco: Never  ?Substance Use Topics  ? Alcohol use: Yes  ?  Comment: occ  ? Drug use: Never  ? ?Allergies   ?Patient has no known allergies. ? ?Review of Systems ?Review of Systems ?Pertinent findings noted in history of present illness.  ? ?Physical Exam ?Triage Vital Signs ?ED Triage Vitals  ?Enc Vitals Group  ?   BP 03/18/21 0827 (!) 147/82  ?   Pulse Rate 03/18/21 0827 72  ?    Resp 03/18/21 0827 18  ?   Temp 03/18/21 0827 98.3 ?F (36.8 ?C)  ?   Temp Source 03/18/21 0827 Oral  ?   SpO2 03/18/21 0827 98 %  ?   Weight --   ?   Height --   ?   Head Circumference --   ?   Peak Flow --   ?   Pain Score 03/18/21 0826 5  ?   Pain Loc --   ?   Pain Edu? --   ?   Excl. in [place]? --   ?No data found. ? ?Updated Vital Signs ?BP (!) 148/90 (BP Location: Right Arm)   Pulse 85   Temp 97.9 ?F (36.6 ?C) (Oral)   Resp 20   SpO2 95%  ? ?Physical Exam ?Vitals and nursing note reviewed.  ?Constitutional:   ?   General: He is not in acute distress. ?   Appearance: Normal appearance. He is not ill-appearing.  ?HENT:  ?   Head: Normocephalic and atraumatic.  ?   Salivary Glands: Right salivary gland is not diffusely enlarged or tender. Left salivary gland is not diffusely enlarged or tender.  ?   Right Ear: Ear canal and external ear normal. No drainage. A middle ear effusion is  present. There is no impacted cerumen. Tympanic membrane is bulging. Tympanic membrane is not injected or erythematous.  ?   Left Ear: Ear canal and external ear normal. No drainage. A middle ear effusion is present. There is no impacted cerumen. Tympanic membrane is bulging. Tympanic membrane is not injected or erythematous.  ?   Ears:  ?   Comments: Bilateral EACs normal, both TMs bulging with clear fluid ?   Nose: Rhinorrhea present. No nasal deformity, septal deviation, signs of injury, nasal tenderness, mucosal edema or congestion. Rhinorrhea is clear.  ?   Right Nostril: Occlusion present. No foreign body, epistaxis or septal hematoma.  ?   Left Nostril: Occlusion present. No foreign body, epistaxis or septal hematoma.  ?   Right Turbinates: Enlarged, swollen and pale.  ?   Left Turbinates: Enlarged, swollen and pale.  ?   Right Sinus: No maxillary sinus tenderness or frontal sinus tenderness.  ?   Left Sinus: No maxillary sinus tenderness or frontal sinus tenderness.  ?   Mouth/Throat:  ?   Lips: Pink. No lesions.  ?   Mouth:  Mucous membranes are moist. No oral lesions.  ?   Pharynx: Oropharynx is clear. Uvula midline. No posterior oropharyngeal erythema or uvula swelling.  ?   Tonsils: No tonsillar exudate. 0 on the right. 0 on the left.  ?   Comments: Postnasal drip ?Eyes:  ?   General: Lids are normal.     ?   Right eye: No discharge.     ?   Left eye: No discharge.  ?   Extraocular Movements: Extraocular movements intact.  ?   Conjunctiva/sclera: Conjunctivae normal.  ?   Right eye: Right conjunctiva is not injected.  ?   Left eye: Left conjunctiva is not injected.  ?Neck:  ?   Trachea: Trachea and phonation normal.  ?Cardiovascular:  ?   Rate and Rhythm: Normal rate and regular rhythm.  ?   Pulses: Normal pulses.  ?   Heart sounds: Normal heart sounds. No murmur heard. ?  No friction rub. No gallop.  ?Pulmonary:  ?   Effort: Pulmonary effort is normal. No accessory muscle usage, prolonged expiration or respiratory distress.  ?   Breath sounds: Normal breath sounds. No stridor, decreased air movement or transmitted upper airway sounds. No decreased breath sounds, wheezing, rhonchi or rales.  ?Chest:  ?   Chest wall: No tenderness.  ?Musculoskeletal:     ?   General: Normal range of motion.  ?   Cervical back: Normal range of motion and neck supple. Normal range of motion.  ?Lymphadenopathy:  ?   Cervical: No cervical adenopathy.  ?Skin: ?   General: Skin is warm and dry.  ?   Findings: No erythema or rash.  ?Neurological:  ?   General: No focal deficit present.  ?   Mental Status: He is alert and oriented to person, place, and time.  ?Psychiatric:     ?   Mood and Affect: Mood normal.     ?   Behavior: Behavior normal.  ? ? ?Visual Acuity ?Right Eye Distance:   ?Left Eye Distance:   ?Bilateral Distance:   ? ?Right Eye Near:   ?Left Eye Near:    ?Bilateral Near:    ? ?UC Couse / Diagnostics / Procedures:  ?  ?EKG ? ?Radiology ?No results found. ? ?Procedures ?Procedures (including critical care time) ? ?UC Diagnoses / Final Clinical  Impressions(s)   ?I have reviewed  the triage vital signs and the nursing notes. ? ?Pertinent labs & imaging results that were available during my care of the patient were reviewed by me and considered in my medical decision making (see chart for details).   ?Final diagnoses:  ?Exposure to COVID-19 virus  ?Acute cough  ?Rhinosinusitis  ? ?Patient tested for COVID-19 at his request.  With Promethazine DM for nighttime cough and Atrovent nasal spray for significant rhinosinusitis.  Return precautions advised.  If patient test positive for COVID-19, he would benefit from Paxlovid. ? ?ED Prescriptions   ? ? Medication Sig Dispense Auth. Provider  ? ipratropium (ATROVENT) 0.06 % nasal spray Place 2 sprays into both nostrils 4 (four) times daily. As needed for nasal congestion, runny nose 15 mL Lynden Oxford Scales, PA-C  ? promethazine-dextromethorphan (PROMETHAZINE-DM) 6.25-15 MG/5ML syrup Take 5 mLs by mouth 4 (four) times daily as needed for cough. 180 mL Lynden Oxford Scales, PA-C  ? ?  ? ?PDMP not reviewed this encounter. ? ?Pending results:  ?Labs Reviewed  ?NOVEL CORONAVIRUS, NAA  ? ? ?Medications Ordered in UC: ?Medications - No data to display ? ?Disposition Upon Discharge:  ?Condition: stable for discharge home ?Home: take medications as prescribed; routine discharge instructions as discussed; follow up as advised. ? ?Patient presented with an acute illness with associated systemic symptoms and significant discomfort requiring urgent management. In my opinion, this is a condition that a prudent lay person (someone who possesses an average knowledge of health and medicine) may potentially expect to result in complications if not addressed urgently such as respiratory distress, impairment of bodily function or dysfunction of bodily organs.  ? ?Routine symptom specific, illness specific and/or disease specific instructions were discussed with the patient and/or caregiver at length.  ? ?As such, the patient has  been evaluated and assessed, work-up was performed and treatment was provided in alignment with urgent care protocols and evidence based medicine.  Patient/parent/caregiver has been advised that the patient

## 2021-08-08 NOTE — Discharge Instructions (Signed)
Your symptoms and physical exam findings are concerning for a viral respiratory infection.  Based on my physical exam findings, I believe you are suffering from ?COVID-19. ? ?You were tested for both COVID-19 today, the result of your viral testing will be posted to your MyChart once it is complete, this typically takes 24 to 48 hours.  If there is a positive result, you will be contacted by phone with further recommendations, if any.  I provided you with a note to be out of work until March 22.  If your COVID-19 test is positive, and you are still having symptoms, please let us know and we will provide you with an extension of your note. ?  ?Please see the list below for recommended medications, dosages and frequencies to provide relief of your current symptoms:   ?  ?Ibuprofen  (Advil, Motrin): This is a good anti-inflammatory medication which addresses aches, pains and inflammation of the upper airways that causes sinus and nasal congestion as well as in the lower airways which makes your cough feel tight and sometimes burn.  I recommend that you take between 400 to 600 mg every 6-8 hours as needed.    ?  ?Acetaminophen (Tylenol): This is a good fever reducer.  If your body temperature rises above 101.5 as measured with a thermometer, it is recommended that you take 1,000 mg every 8 hours until your temperature falls below 101.5, please not take more than 3,000 mg of acetaminophen either as a separate medication or as in ingredient in an over-the-counter cold/flu preparation within a 24-hour period.    ?  ?Ipratropium (Atrovent): This is an excellent nasal decongestant spray that does not cause rebound congestion, can be used up to 4 times daily as needed, instill 2 sprays into each nare with each use.  I have provided you with a prescription for this medication.    ?  ?Guaifenesin (Robitussin, Mucinex): This is an expectorant.  This helps break up chest congestion and loosen up thick nasal drainage making phlegm  and drainage more liquid and therefore easier to remove.  I recommend being 400 mg three times daily as needed.    ?  ?Promethazine DM: Promethazine is both a nasal decongestant and an antinausea medication that makes most patients feel fairly sleepy.  The DM is dextromethorphan, a cough suppressant found in many over-the-counter cough medications.  Please take 5 mL before bedtime to minimize your cough which will help you sleep better.  I have provided you with a prescription for this medication.    ?  ?Conservative care is also recommended at this time.  This includes rest, pushing clear fluids and activity as tolerated.  Warm beverages such as teas and broths versus cold beverages/popsicles and frozen sherbet/sorbet are your choice, both warm and cold are beneficial.  You may also notice that your appetite is reduced; this is okay as long as you are drinking plenty of clear fluids.  ?  ?Please follow-up within the next 3 to 5 days either with your primary care provider or urgent care if your symptoms do not resolve.  If you do not have a primary care provider, we will assist you in finding one. ?  ?Thank you for visiting urgent care today.  We appreciate the opportunity to participate in your care. ? ?

## 2021-08-08 NOTE — ED Triage Notes (Signed)
Pt states this weekend he has had a productive cough. He is requesting to be tested for Covid d/t possible exposure.  ?

## 2021-08-09 LAB — NOVEL CORONAVIRUS, NAA: SARS-CoV-2, NAA: NOT DETECTED

## 2021-08-10 ENCOUNTER — Telehealth: Payer: Self-pay

## 2021-08-10 NOTE — Telephone Encounter (Signed)
Pt presents to urgent care today requesting another work note and results of Covid test. Patient made aware of Covid test results and all questions answered. ?

## 2021-08-31 DIAGNOSIS — Z6835 Body mass index (BMI) 35.0-35.9, adult: Secondary | ICD-10-CM | POA: Diagnosis not present

## 2021-08-31 DIAGNOSIS — M62838 Other muscle spasm: Secondary | ICD-10-CM | POA: Diagnosis not present

## 2022-04-26 DIAGNOSIS — Z125 Encounter for screening for malignant neoplasm of prostate: Secondary | ICD-10-CM | POA: Diagnosis not present

## 2022-04-26 DIAGNOSIS — E785 Hyperlipidemia, unspecified: Secondary | ICD-10-CM | POA: Diagnosis not present

## 2022-04-26 DIAGNOSIS — E119 Type 2 diabetes mellitus without complications: Secondary | ICD-10-CM | POA: Diagnosis not present

## 2022-04-26 DIAGNOSIS — I10 Essential (primary) hypertension: Secondary | ICD-10-CM | POA: Diagnosis not present

## 2022-05-08 DIAGNOSIS — Z6834 Body mass index (BMI) 34.0-34.9, adult: Secondary | ICD-10-CM | POA: Diagnosis not present

## 2022-05-08 DIAGNOSIS — E1169 Type 2 diabetes mellitus with other specified complication: Secondary | ICD-10-CM | POA: Diagnosis not present

## 2022-05-08 DIAGNOSIS — E785 Hyperlipidemia, unspecified: Secondary | ICD-10-CM | POA: Diagnosis not present

## 2022-05-08 DIAGNOSIS — I10 Essential (primary) hypertension: Secondary | ICD-10-CM | POA: Diagnosis not present

## 2022-08-30 DIAGNOSIS — E785 Hyperlipidemia, unspecified: Secondary | ICD-10-CM | POA: Diagnosis not present

## 2022-08-30 DIAGNOSIS — E1169 Type 2 diabetes mellitus with other specified complication: Secondary | ICD-10-CM | POA: Diagnosis not present

## 2022-08-30 DIAGNOSIS — I10 Essential (primary) hypertension: Secondary | ICD-10-CM | POA: Diagnosis not present

## 2022-09-06 DIAGNOSIS — E785 Hyperlipidemia, unspecified: Secondary | ICD-10-CM | POA: Diagnosis not present

## 2022-09-06 DIAGNOSIS — Z6835 Body mass index (BMI) 35.0-35.9, adult: Secondary | ICD-10-CM | POA: Diagnosis not present

## 2022-09-06 DIAGNOSIS — M170 Bilateral primary osteoarthritis of knee: Secondary | ICD-10-CM | POA: Diagnosis not present

## 2022-09-06 DIAGNOSIS — E119 Type 2 diabetes mellitus without complications: Secondary | ICD-10-CM | POA: Diagnosis not present

## 2022-09-06 DIAGNOSIS — I10 Essential (primary) hypertension: Secondary | ICD-10-CM | POA: Diagnosis not present

## 2022-11-04 DIAGNOSIS — R112 Nausea with vomiting, unspecified: Secondary | ICD-10-CM | POA: Diagnosis not present

## 2022-11-07 DIAGNOSIS — Z7689 Persons encountering health services in other specified circumstances: Secondary | ICD-10-CM | POA: Diagnosis not present

## 2022-11-07 DIAGNOSIS — Z6834 Body mass index (BMI) 34.0-34.9, adult: Secondary | ICD-10-CM | POA: Diagnosis not present

## 2022-11-07 DIAGNOSIS — E119 Type 2 diabetes mellitus without complications: Secondary | ICD-10-CM | POA: Diagnosis not present

## 2023-01-02 DIAGNOSIS — E785 Hyperlipidemia, unspecified: Secondary | ICD-10-CM | POA: Diagnosis not present

## 2023-01-02 DIAGNOSIS — E1169 Type 2 diabetes mellitus with other specified complication: Secondary | ICD-10-CM | POA: Diagnosis not present

## 2023-01-02 DIAGNOSIS — I10 Essential (primary) hypertension: Secondary | ICD-10-CM | POA: Diagnosis not present

## 2023-01-10 DIAGNOSIS — I10 Essential (primary) hypertension: Secondary | ICD-10-CM | POA: Diagnosis not present

## 2023-01-10 DIAGNOSIS — E785 Hyperlipidemia, unspecified: Secondary | ICD-10-CM | POA: Diagnosis not present

## 2023-01-10 DIAGNOSIS — Z6834 Body mass index (BMI) 34.0-34.9, adult: Secondary | ICD-10-CM | POA: Diagnosis not present

## 2023-01-10 DIAGNOSIS — E1169 Type 2 diabetes mellitus with other specified complication: Secondary | ICD-10-CM | POA: Diagnosis not present

## 2023-02-08 DIAGNOSIS — M25562 Pain in left knee: Secondary | ICD-10-CM | POA: Diagnosis not present

## 2023-06-15 DIAGNOSIS — I10 Essential (primary) hypertension: Secondary | ICD-10-CM | POA: Diagnosis not present

## 2023-06-15 DIAGNOSIS — E785 Hyperlipidemia, unspecified: Secondary | ICD-10-CM | POA: Diagnosis not present

## 2023-06-15 DIAGNOSIS — E1169 Type 2 diabetes mellitus with other specified complication: Secondary | ICD-10-CM | POA: Diagnosis not present

## 2023-06-21 DIAGNOSIS — M1711 Unilateral primary osteoarthritis, right knee: Secondary | ICD-10-CM | POA: Diagnosis not present

## 2023-06-27 DIAGNOSIS — Z6835 Body mass index (BMI) 35.0-35.9, adult: Secondary | ICD-10-CM | POA: Diagnosis not present

## 2023-06-27 DIAGNOSIS — I10 Essential (primary) hypertension: Secondary | ICD-10-CM | POA: Diagnosis not present

## 2023-06-27 DIAGNOSIS — E785 Hyperlipidemia, unspecified: Secondary | ICD-10-CM | POA: Diagnosis not present

## 2023-06-27 DIAGNOSIS — E1169 Type 2 diabetes mellitus with other specified complication: Secondary | ICD-10-CM | POA: Diagnosis not present

## 2023-10-24 DIAGNOSIS — I10 Essential (primary) hypertension: Secondary | ICD-10-CM | POA: Diagnosis not present

## 2023-10-24 DIAGNOSIS — E785 Hyperlipidemia, unspecified: Secondary | ICD-10-CM | POA: Diagnosis not present

## 2023-10-24 DIAGNOSIS — E1169 Type 2 diabetes mellitus with other specified complication: Secondary | ICD-10-CM | POA: Diagnosis not present

## 2023-11-07 DIAGNOSIS — E785 Hyperlipidemia, unspecified: Secondary | ICD-10-CM | POA: Diagnosis not present

## 2023-11-07 DIAGNOSIS — E1169 Type 2 diabetes mellitus with other specified complication: Secondary | ICD-10-CM | POA: Diagnosis not present

## 2023-11-07 DIAGNOSIS — Z6834 Body mass index (BMI) 34.0-34.9, adult: Secondary | ICD-10-CM | POA: Diagnosis not present

## 2023-11-07 DIAGNOSIS — I10 Essential (primary) hypertension: Secondary | ICD-10-CM | POA: Diagnosis not present

## 2023-11-29 ENCOUNTER — Encounter: Payer: Self-pay | Admitting: Gastroenterology

## 2023-11-29 ENCOUNTER — Telehealth: Payer: Self-pay | Admitting: Gastroenterology

## 2023-11-29 NOTE — Telephone Encounter (Signed)
 PT would like to speak with nurse regarding blood in his stool. Please advise.

## 2023-11-30 NOTE — Telephone Encounter (Addendum)
 Patient has not been seen in 5 years, he has an appt with Deanna May, NP on 01/24/24. I called and left patient a detailed vm letting him know that since it has been 5 years I will not be able to advise. I recommended that patient contact PCP or an urgent care for evaluation in the interim.

## 2024-01-23 ENCOUNTER — Telehealth: Payer: Self-pay | Admitting: Gastroenterology

## 2024-01-23 NOTE — Telephone Encounter (Signed)
 error

## 2024-01-24 ENCOUNTER — Ambulatory Visit: Admitting: Gastroenterology

## 2024-01-24 ENCOUNTER — Encounter: Payer: Self-pay | Admitting: Gastroenterology

## 2024-01-24 VITALS — BP 106/70 | HR 88 | Ht 75.5 in | Wt 283.5 lb

## 2024-01-24 DIAGNOSIS — Z860100 Personal history of colon polyps, unspecified: Secondary | ICD-10-CM | POA: Diagnosis not present

## 2024-01-24 DIAGNOSIS — K625 Hemorrhage of anus and rectum: Secondary | ICD-10-CM | POA: Diagnosis not present

## 2024-01-24 MED ORDER — NA SULFATE-K SULFATE-MG SULF 17.5-3.13-1.6 GM/177ML PO SOLN: 1.0000 | Freq: Once | ORAL | 0 refills | Status: AC

## 2024-01-24 NOTE — Progress Notes (Signed)
 Agree with the assessment and plan as outlined by [facility], FNP-C.  Carlitos Bottino, DO, [facility]

## 2024-01-24 NOTE — Progress Notes (Signed)
 Chief Complaint: blood in stool Primary GI Doctor: Dr. San  HPI:  Patient is a  58  year old A.A. male patient with past medical history of hypertension, who was self referred to me for a evaluation of blood in stool .    Interval History    Patient presents for evaluation of BRB with wiping on three separate occasions that occurred beginning of July. He reports the first episode was two episodes back to back bowel movements. Third episode following week with bowel movement. He reports he has been straining intermittently. He has daily bowel movement. He reports he did pass large hard stool around the same time BRB occurred. No rectal pain afterwards. He has not had episode since then, No abdominal pain, nausea or vomiting.   No blood thinners.  Patient's family history: no family history of colon CA   Wt Readings from Last 3 Encounters:  01/24/24 283 lb 8 oz (128.6 kg)  06/18/18 284 lb (128.8 kg)  05/20/18 284 lb (128.8 kg)     Past Medical History:  Diagnosis Date   DM (diabetes mellitus) (HCC)    HLD (hyperlipidemia)    Hypertension     Past Surgical History:  Procedure Laterality Date   KNEE SURGERY Right    1985, 1991    Current Outpatient Medications  Medication Sig Dispense Refill   atorvastatin (LIPITOR) 10 MG tablet Take 10 mg by mouth daily.     valsartan-hydrochlorothiazide (DIOVAN-HCT) 320-25 MG tablet Take 1 tablet by mouth daily.     No current facility-administered medications for this visit.    Allergies as of 01/24/2024   (No Known Allergies)    Family History  Problem Relation Age of Onset   Hypertension Mother    Colon cancer Neg Hx    Colon polyps Neg Hx    Esophageal cancer Neg Hx    Rectal cancer Neg Hx    Stomach cancer Neg Hx     Review of Systems:    Constitutional: No weight loss, fever, chills, weakness or fatigue HEENT: Eyes: No change in vision               Ears, Nose, Throat:  No change in hearing or congestion Skin:  No rash or itching Cardiovascular: No chest pain, chest pressure or palpitations   Respiratory: No SOB or cough Gastrointestinal: See HPI and otherwise negative Genitourinary: No dysuria or change in urinary frequency Neurological: No headache, dizziness or syncope Musculoskeletal: No new muscle or joint pain Hematologic: No bleeding or bruising Psychiatric: No history of depression or anxiety    Physical Exam:  Vital signs: BP 106/70 (BP Location: Left Arm, Patient Position: Sitting, Cuff Size: Large)   Pulse 88   Ht 6' 3.5 (1.918 m) Comment: height measured without shoes  Wt 283 lb 8 oz (128.6 kg)   BMI 34.97 kg/m   Constitutional:   Pleasant A.A. male appears to be in NAD, Well developed, Well nourished, alert and cooperative Throat: Oral cavity and pharynx without inflammation, swelling or lesion.  Respiratory: Respirations even and unlabored. Lungs clear to auscultation bilaterally.   No wheezes, crackles, or rhonchi.  Cardiovascular: Normal S1, S2. Regular rate and rhythm. No peripheral edema, cyanosis or pallor.  Gastrointestinal:  Soft, nondistended, nontender. No rebound or guarding. Normal bowel sounds. No appreciable masses or hepatomegaly. Rectal:  Not performed.  Msk:  Symmetrical without gross deformities. Without edema, no deformity or joint abnormality.  Neurologic:  Alert and  oriented x4;  grossly normal neurologically.  Skin:   Dry and intact without significant lesions or rashes.  RELEVANT LABS AND IMAGING: CBC    Latest Ref Rng & Units 09/21/2018    1:04 PM  CBC  WBC 4.0 - 10.5 K/uL 7.3   Hemoglobin 13.0 - 17.0 g/dL 84.2   Hematocrit 60.9 - 52.0 % 47.0   Platelets 150 - 400 K/uL 211     GI procedures: 06/18/2018 colonoscopy, recall 10 years - One 8 mm polyp in the sigmoid colon, removed with a cold snare. Resected and retrieved. - Diverticulosis in the sigmoid colon, in the descending colon, in the transverse colon and in the ascending colon. - Small  lipoma in the sigmoid colon. - The distal rectum and anal verge are normal on retroflexion view. - The examined portion of the ileum was normal. Path: Diagnosis Surgical [P], sigmoid colon, polyps - HYPERPLASTIC POLYP (ONE). - NO ADENOMATOUS CHANGE OR MALIGNANCY.  Assessment: Encounter Diagnoses  Name Primary?   Rectal bleeding Yes   History of colonic polyps      58 year old male patient who presents for evaluation after three episodes of BRB with wiping about 2 months ago. Patient does state he was straining and passed large hard stool around the same time. Recommended high fiber diet. Stool softeners prn. Patient anxious about symptoms and would like to pursue colonoscopy with history of colon polyps. Will go ahead and schedule in [facility] with Dr. San.  Plan: - recommend High fiber diet -No straining or pushing with bowel movements - Can use stool softeners prn -Schedule for a colonoscopy in [facility] Dr. San . The risks and benefits of colonoscopy with possible polypectomy / biopsies were discussed and the patient agrees to proceed.    Thank you for the courtesy of this consult. Please call me with any questions or concerns.   Javani Spratt, FNP-C [facility] 01/24/2024, 10:47 AM  Cc: Trinidad Glisson, MD

## 2024-01-24 NOTE — Patient Instructions (Addendum)
 Recommend high fiber diet Drink plenty of water No straining or pushing during bowel movements OTC stool softeners as needed for passing hard stools Squatty potty may help with ease of bowel movement  We have sent the following medications to your pharmacy for you to pick up at your convenience: SUPREP  You have been scheduled for a colonoscopy. Please follow written instructions given to you at your visit today.   If you use inhalers (even only as needed), please bring them with you on the day of your procedure.  DO NOT TAKE 7 DAYS PRIOR TO TEST- Trulicity (dulaglutide) Ozempic, Wegovy (semaglutide) Mounjaro (tirzepatide) Bydureon Bcise (exanatide extended release)  DO NOT TAKE 1 DAY PRIOR TO YOUR TEST Rybelsus (semaglutide) Adlyxin (lixisenatide) Victoza (liraglutide) Byetta (exanatide) ___________________________________________________________________________  Due to recent changes in healthcare laws, you may see the results of your imaging and laboratory studies on MyChart before your provider has had a chance to review them.  We understand that in some cases there may be results that are confusing or concerning to you. Not all laboratory results come back in the same time frame and the provider may be waiting for multiple results in order to interpret others.  Please give us  48 hours in order for your provider to thoroughly review all the results before contacting the office for clarification of your results.   _______________________________________________________  If your blood pressure at your visit was 140/90 or greater, please contact your primary care physician to follow up on this.  _______________________________________________________  If you are age 59 or older, your body mass index should be between 23-30. Your Body mass index is 34.97 kg/m. If this is out of the aforementioned range listed, please consider follow up with your Primary Care Provider.  If you are  age 94 or younger, your body mass index should be between 19-25. Your Body mass index is 34.97 kg/m. If this is out of the aformentioned range listed, please consider follow up with your Primary Care Provider.   ________________________________________________________  The  GI providers would like to encourage you to use MYCHART to communicate with providers for non-urgent requests or questions.  Due to long hold times on the telephone, sending your provider a message by [employer] may be a faster and more efficient way to get a response.  Please allow 48 business hours for a response.  Please remember that this is for non-urgent requests.  _______________________________________________________  [facility] is using a team-based approach to care.  Your team is made up of your doctor and two to three APPS. Our APPS (Nurse Practitioners and Physician Assistants) work with your physician to ensure care continuity for you. They are fully qualified to address your health concerns and develop a treatment plan. They communicate directly with your gastroenterologist to care for you. Seeing the Advanced Practice Practitioners on your physician's team can help you by facilitating care more promptly, often allowing for earlier appointments, access to diagnostic testing, procedures, and other specialty referrals.   Thank you for trusting me with your gastrointestinal care. Deanna May, FNP-C

## 2024-03-07 ENCOUNTER — Other Ambulatory Visit: Payer: Self-pay

## 2024-03-07 ENCOUNTER — Telehealth: Payer: Self-pay | Admitting: Gastroenterology

## 2024-03-07 MED ORDER — NA SULFATE-K SULFATE-MG SULF 17.5-3.13-1.6 GM/177ML PO SOLN: 1.0000 | Freq: Once | ORAL | 0 refills | Status: AC

## 2024-03-07 NOTE — Telephone Encounter (Signed)
 Inbound call from patient stating he needs prep medication sent to pharmacy in file for procedure on 03/10/24 Please advise  Thank you

## 2024-03-10 ENCOUNTER — Ambulatory Visit (AMBULATORY_SURGERY_CENTER): Admitting: Gastroenterology

## 2024-03-10 ENCOUNTER — Encounter: Payer: Self-pay | Admitting: Gastroenterology

## 2024-03-10 VITALS — BP 146/92 | HR 75 | Temp 98.1°F | Resp 11 | Ht 75.0 in | Wt 283.0 lb

## 2024-03-10 DIAGNOSIS — K648 Other hemorrhoids: Secondary | ICD-10-CM | POA: Diagnosis not present

## 2024-03-10 DIAGNOSIS — K625 Hemorrhage of anus and rectum: Secondary | ICD-10-CM

## 2024-03-10 DIAGNOSIS — K921 Melena: Secondary | ICD-10-CM

## 2024-03-10 DIAGNOSIS — K6389 Other specified diseases of intestine: Secondary | ICD-10-CM | POA: Diagnosis not present

## 2024-03-10 DIAGNOSIS — D125 Benign neoplasm of sigmoid colon: Secondary | ICD-10-CM

## 2024-03-10 DIAGNOSIS — K635 Polyp of colon: Secondary | ICD-10-CM | POA: Diagnosis not present

## 2024-03-10 DIAGNOSIS — K5730 Diverticulosis of large intestine without perforation or abscess without bleeding: Secondary | ICD-10-CM | POA: Diagnosis not present

## 2024-03-10 DIAGNOSIS — K641 Second degree hemorrhoids: Secondary | ICD-10-CM

## 2024-03-10 MED ORDER — SODIUM CHLORIDE 0.9 % IV SOLN: 500.0000 mL | Freq: Once | INTRAVENOUS | Status: DC

## 2024-03-10 NOTE — Patient Instructions (Signed)
 - Resume previous diet. - Continue present medications. - Await pathology results. - Repeat colonoscopy in 2 years because the bowel preparation was suboptimal and for surveillance. - Return to GI office PRN. - Internal hemorrhoids were noted on this study and may be amenable to hemorrhoid band ligation. If you are interested in further treatment of these hemorrhoids with band ligation, please contact my clinic to set up an appointment for evaluation and treatment.  Sandor Flatter, MD   YOU HAD AN ENDOSCOPIC PROCEDURE TODAY AT THE [facility]:   Refer to the procedure report that was given to you for any specific questions about what was found during the examination.  If the procedure report does not answer your questions, please call your gastroenterologist to clarify.  If you requested that your care partner not be given the details of your procedure findings, then the procedure report has been included in a sealed envelope for you to review at your convenience later.  YOU SHOULD EXPECT: Some feelings of bloating in the abdomen. Passage of more gas than usual.  Walking can help get rid of the air that was put into your GI tract during the procedure and reduce the bloating. If you had a lower endoscopy (such as a colonoscopy or flexible sigmoidoscopy) you may notice spotting of blood in your stool or on the toilet paper. If you underwent a bowel prep for your procedure, you may not have a normal bowel movement for a few days.  Please Note:  You might notice some irritation and congestion in your nose or some drainage.  This is from the oxygen used during your procedure.  There is no need for concern and it should clear up in a day or so.  SYMPTOMS TO REPORT IMMEDIATELY:  Following lower endoscopy (colonoscopy or flexible sigmoidoscopy):  Excessive amounts of blood in the stool  Significant tenderness or worsening of abdominal pains  Swelling of the abdomen that is new,  acute  Fever of 100F or higher  For urgent or emergent issues, a gastroenterologist can be reached at any hour by calling [phone]. Do not use MyChart messaging for urgent concerns.    DIET:  We do recommend a small meal at first, but then you may proceed to your regular diet.  Drink plenty of fluids but you should avoid alcoholic beverages for 24 hours.  ACTIVITY:  You should plan to take it easy for the rest of today and you should NOT DRIVE or use heavy machinery until tomorrow (because of the sedation medicines used during the test).    FOLLOW UP: Our staff will call the number listed on your records the next business day following your procedure.  We will call around 7:15- 8:00 am to check on you and address any questions or concerns that you may have regarding the information given to you following your procedure. If we do not reach you, we will leave a message.     If any biopsies were taken you will be contacted by phone or by letter within the next 1-3 weeks.  Please call us  at [phone] if you have not heard about the biopsies in 3 weeks.    SIGNATURES/CONFIDENTIALITY: You and/or your care partner have signed paperwork which will be entered into your electronic medical record.  These signatures attest to the fact that that the information above on your After Visit Summary has been reviewed and is understood.  Full responsibility of the confidentiality of this discharge information  lies with you and/or your care-partner.

## 2024-03-10 NOTE — Progress Notes (Signed)
Updated medical record with pt

## 2024-03-10 NOTE — Progress Notes (Signed)
 Vss nad trans to pacu

## 2024-03-10 NOTE — Progress Notes (Signed)
 GASTROENTEROLOGY PROCEDURE H&P NOTE   Primary Care Physician: Trinidad Glisson, MD    Reason for Procedure:  Hematochezia  Plan:    Colonoscopy  Patient is appropriate for endoscopic procedure(s) in the ambulatory ([facility]) setting.  The nature of the procedure, as well as the risks, benefits, and alternatives were carefully and thoroughly reviewed with the patient. Ample time for discussion and questions allowed. The patient understood, was satisfied, and agreed to proceed.     HPI: Ryan Padilla is a 58 y.o. male who presents for colonoscopy for evaluation of episodic hematochezia.  He has had 3 episodes of BRBPR over the last 3 months.  Patient was most recently seen in the Gastroenterology Clinic on 9//2025.  No interval change in medical history since that appointment. Please refer to that note for full details regarding GI history and clinical presentation.   Last colonoscopy was 05/2018 and notable for 8 mm sigmoid hyperplastic polyp, pandiverticulosis, small lipoma in the sigmoid colon.  Past Medical History:  Diagnosis Date   DM (diabetes mellitus) (HCC)    HLD (hyperlipidemia)    Hypertension     Past Surgical History:  Procedure Laterality Date   COLONOSCOPY  06/18/2018   TA   KNEE SURGERY Right    1985, 1991    Prior to Admission medications   Medication Sig Start Date End Date Taking? Authorizing Provider  valsartan-hydrochlorothiazide (DIOVAN-HCT) 320-25 MG tablet Take 1 tablet by mouth daily. 11/13/23  Yes [provider]    Current Outpatient Medications  Medication Sig Dispense Refill   valsartan-hydrochlorothiazide (DIOVAN-HCT) 320-25 MG tablet Take 1 tablet by mouth daily.     Current Facility-Administered Medications  Medication Dose Route Frequency Provider Last Rate Last Admin   0.9 %  sodium chloride  infusion  500 mL Intravenous Once Titus Drone V, DO        Allergies as of 03/10/2024   (No Known Allergies)    Family History   Problem Relation Age of Onset   Hypertension Mother    Colon cancer Neg Hx    Colon polyps Neg Hx    Esophageal cancer Neg Hx    Rectal cancer Neg Hx    Stomach cancer Neg Hx     Social History   Socioeconomic History   Marital status: Single    Spouse name: Not on file   Number of children: 2   Years of education: Not on file   Highest education level: Not on file  Occupational History   Not on file  Tobacco Use   Smoking status: Never   Smokeless tobacco: Never  Vaping Use   Vaping status: Never Used  Substance and Sexual Activity   Alcohol use: Yes    Comment: 1-2 beers occasional   Drug use: Never   Sexual activity: Not on file  Other Topics Concern   Not on file  Social History Narrative   Not on file   Social Drivers of Health   Financial Resource Strain: Not on file  Food Insecurity: Not on file  Transportation Needs: Not on file  Physical Activity: Not on file  Stress: Not on file  Social Connections: Not on file  Intimate Partner Violence: Not on file    Physical Exam: Vital signs in last 24 hours: @BP  (!) 142/94   Pulse 78   Temp 98.1 F (36.7 C) (Temporal)   Ht 6' 3 (1.905 m)   Wt 283 lb (128.4 kg)   SpO2 95%   BMI 35.37  kg/m  GEN: NAD EYE: Sclerae anicteric ENT: MMM CV: Non-tachycardic Pulm: CTA b/l GI: Soft, NT/ND NEURO:  Alert & Oriented x 3   Sandor Flatter, DO [facility]   03/10/2024 7:56 AM

## 2024-03-10 NOTE — Progress Notes (Signed)
 Called to room to assist during endoscopic procedure.  Patient ID and intended procedure confirmed with present staff. Received instructions for my participation in the procedure from the performing physician.

## 2024-03-10 NOTE — Op Note (Signed)
 [facility] Patient Name: Ryan Padilla Procedure Date: 03/10/2024 8:35 AM MRN: [id number] Endoscopist: Sandor Flatter , MD, [id number] Age: 58 Referring MD:  Date of Birth: 1966/05/12 Gender: Male Account #: [id number] Procedure:                Colonoscopy Indications:              Hematochezia.                           Last colonoscopy was 05/2018 and notable for 8 mm                            sigmoid hyperplastic polyp, diverticulosis. Medicines:                Monitored Anesthesia Care Procedure:                Pre-Anesthesia Assessment:                           - Prior to the procedure, a History and Physical                            was performed, and patient medications and                            allergies were reviewed. The patient's tolerance of                            previous anesthesia was also reviewed. The risks                            and benefits of the procedure and the sedation                            options and risks were discussed with the patient.                            All questions were answered, and informed consent                            was obtained. Prior Anticoagulants: The patient has                            taken no anticoagulant or antiplatelet agents. ASA                            Grade Assessment: III - A patient with severe                            systemic disease. After reviewing the risks and                            benefits, the patient was deemed in satisfactory  condition to undergo the procedure.                           After obtaining informed consent, the colonoscope                            was passed under direct vision. Throughout the                            procedure, the patient's blood pressure, pulse, and                            oxygen saturations were monitored continuously. The                            Olympus Scope [id number] was introduced through  the                            anus and advanced to the the terminal ileum. The                            colonoscopy was performed without difficulty. The                            patient tolerated the procedure well. The quality                            of the bowel preparation was fair. The terminal                            ileum, ileocecal valve, appendiceal orifice, and                            rectum were photographed. Scope In: 8:51:21 AM Scope Out: 9:04:40 AM Scope Withdrawal Time: 0 hours 10 minutes 56 seconds  Total Procedure Duration: 0 hours 13 minutes 19 seconds  Findings:                 The perianal and digital rectal examinations were                            normal.                           A 3 mm polyp was found in the sigmoid colon. The                            polyp was sessile. The polyp was removed with a                            cold snare. Resection and retrieval were complete.                            Estimated blood loss was minimal.  A few small-mouthed diverticula were found in the                            sigmoid colon, descending colon and transverse                            colon.                           Non-bleeding internal hemorrhoids were found during                            retroflexion. The hemorrhoids were small.                           A moderate amount of stool was found scattered                            throughout the colon, interfering with                            visualization. Lavage of the area was performed                            using copious amounts of sterile water, resulting                            in clearance with fair visualization. There were                            some scattered areas of solid food debris which                            precluded complete visualization. Cannot rule out                            the presence of small or flat polyps in these  areas.                           The terminal ileum appeared normal. Complications:            No immediate complications. Estimated Blood Loss:     Estimated blood loss was minimal. Impression:               - Preparation of the colon was fair.                           - One 3 mm polyp in the sigmoid colon, removed with                            a cold snare. Resected and retrieved.                           - Diverticulosis in the sigmoid colon, in the  descending colon and in the transverse colon.                           - Non-bleeding internal hemorrhoids.                           - Stool in the entire examined colon.                           - The examined portion of the ileum was normal. Recommendation:           - Patient has a contact number available for                            emergencies. The signs and symptoms of potential                            delayed complications were discussed with the                            patient. Return to normal activities tomorrow.                            Written discharge instructions were provided to the                            patient.                           - Resume previous diet.                           - Continue present medications.                           - Await pathology results.                           - Repeat colonoscopy in 2 years because the bowel                            preparation was suboptimal and for surveillance.                           - Return to GI office PRN.                           - Internal hemorrhoids were noted on this study and                            may be amenable to hemorrhoid band ligation. If you                            are interested in further treatment of these  hemorrhoids with band ligation, please contact my                            clinic to set up an appointment for evaluation and                             treatment. Sandor Flatter, MD 03/10/2024 9:11:38 AM

## 2024-03-11 ENCOUNTER — Telehealth: Payer: Self-pay

## 2024-03-11 NOTE — Telephone Encounter (Signed)
 Attempted f/u call, no answer and unable to leave VM.

## 2024-03-13 LAB — SURGICAL PATHOLOGY

## 2024-03-14 ENCOUNTER — Ambulatory Visit: Payer: Self-pay | Admitting: Gastroenterology

## 2024-03-26 DIAGNOSIS — E785 Hyperlipidemia, unspecified: Secondary | ICD-10-CM | POA: Diagnosis not present

## 2024-03-26 DIAGNOSIS — E1169 Type 2 diabetes mellitus with other specified complication: Secondary | ICD-10-CM | POA: Diagnosis not present

## 2024-04-02 DIAGNOSIS — E785 Hyperlipidemia, unspecified: Secondary | ICD-10-CM | POA: Diagnosis not present

## 2024-04-02 DIAGNOSIS — I10 Essential (primary) hypertension: Secondary | ICD-10-CM | POA: Diagnosis not present

## 2024-04-02 DIAGNOSIS — Z2821 Immunization not carried out because of patient refusal: Secondary | ICD-10-CM | POA: Diagnosis not present

## 2024-04-02 DIAGNOSIS — E119 Type 2 diabetes mellitus without complications: Secondary | ICD-10-CM | POA: Diagnosis not present

## 2024-04-02 DIAGNOSIS — E1169 Type 2 diabetes mellitus with other specified complication: Secondary | ICD-10-CM | POA: Diagnosis not present

## 2024-04-02 DIAGNOSIS — Z23 Encounter for immunization: Secondary | ICD-10-CM | POA: Diagnosis not present

## 2024-04-02 DIAGNOSIS — Z6834 Body mass index (BMI) 34.0-34.9, adult: Secondary | ICD-10-CM | POA: Diagnosis not present
